# Patient Record
Sex: Female | Born: 1980 | Race: White | Hispanic: Yes | Marital: Single | Smoking: Never smoker
Health system: Southern US, Community
[De-identification: ages and names within clinical notes are randomized; demographics above are authoritative.]

## PROBLEM LIST (undated history)

## (undated) DIAGNOSIS — I10 Essential (primary) hypertension: Secondary | ICD-10-CM

## (undated) HISTORY — PX: NO PAST SURGERIES: SHX2092

---

## 2005-08-11 ENCOUNTER — Ambulatory Visit: Payer: Self-pay | Admitting: Family Medicine

## 2005-08-12 ENCOUNTER — Ambulatory Visit: Payer: Self-pay | Admitting: Family Medicine

## 2005-08-19 ENCOUNTER — Ambulatory Visit: Payer: Self-pay | Admitting: Sports Medicine

## 2005-08-22 ENCOUNTER — Ambulatory Visit (HOSPITAL_COMMUNITY): Admission: RE | Admit: 2005-08-22 | Discharge: 2005-08-22 | Payer: Self-pay | Admitting: Family Medicine

## 2005-08-27 ENCOUNTER — Ambulatory Visit: Payer: Self-pay | Admitting: Family Medicine

## 2005-08-30 ENCOUNTER — Ambulatory Visit: Payer: Self-pay | Admitting: Family Medicine

## 2005-09-10 ENCOUNTER — Ambulatory Visit: Payer: Self-pay | Admitting: Family Medicine

## 2005-09-18 ENCOUNTER — Ambulatory Visit (HOSPITAL_COMMUNITY): Admission: RE | Admit: 2005-09-18 | Discharge: 2005-09-18 | Payer: Self-pay | Admitting: Family Medicine

## 2005-09-24 ENCOUNTER — Ambulatory Visit: Payer: Self-pay | Admitting: Family Medicine

## 2005-10-01 ENCOUNTER — Ambulatory Visit: Payer: Self-pay | Admitting: Family Medicine

## 2005-10-08 ENCOUNTER — Ambulatory Visit (HOSPITAL_COMMUNITY): Admission: RE | Admit: 2005-10-08 | Discharge: 2005-10-08 | Payer: Self-pay | Admitting: *Deleted

## 2005-10-09 ENCOUNTER — Ambulatory Visit: Payer: Self-pay | Admitting: Family Medicine

## 2005-10-21 ENCOUNTER — Ambulatory Visit: Payer: Self-pay | Admitting: Sports Medicine

## 2005-11-04 ENCOUNTER — Ambulatory Visit: Payer: Self-pay | Admitting: *Deleted

## 2005-11-04 ENCOUNTER — Inpatient Hospital Stay (HOSPITAL_COMMUNITY): Admission: AD | Admit: 2005-11-04 | Discharge: 2005-11-06 | Payer: Self-pay | Admitting: *Deleted

## 2005-11-04 ENCOUNTER — Ambulatory Visit: Payer: Self-pay | Admitting: Family Medicine

## 2005-12-04 ENCOUNTER — Encounter (INDEPENDENT_AMBULATORY_CARE_PROVIDER_SITE_OTHER): Payer: Self-pay | Admitting: *Deleted

## 2005-12-04 LAB — CONVERTED CEMR LAB

## 2005-12-20 ENCOUNTER — Ambulatory Visit: Payer: Self-pay | Admitting: Family Medicine

## 2006-02-04 ENCOUNTER — Ambulatory Visit: Payer: Self-pay | Admitting: Family Medicine

## 2006-03-06 ENCOUNTER — Ambulatory Visit: Payer: Self-pay | Admitting: Family Medicine

## 2006-05-05 ENCOUNTER — Ambulatory Visit: Payer: Self-pay | Admitting: Family Medicine

## 2006-05-22 IMAGING — US US OB FOLLOW-UP
1 series · 13 of 23 positions shown · non-contrast
Comparison: none

CLINICAL DATA: Size less than dates.

[Series 1: us ob follow-up · 0.33mm/px · 13 of 23 slices shown]
[im 1/23]
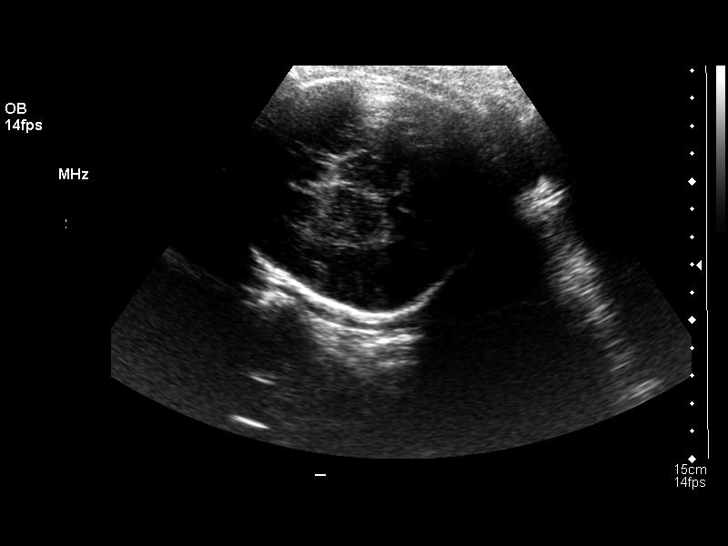
[im 3/23]
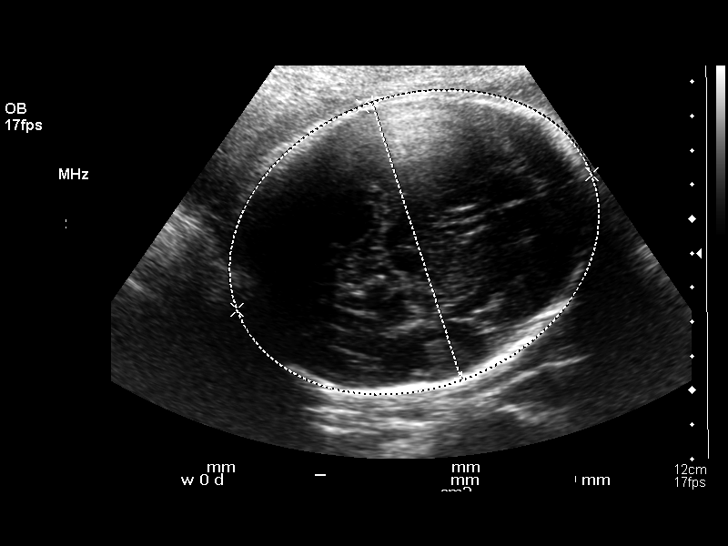
[im 5/23]
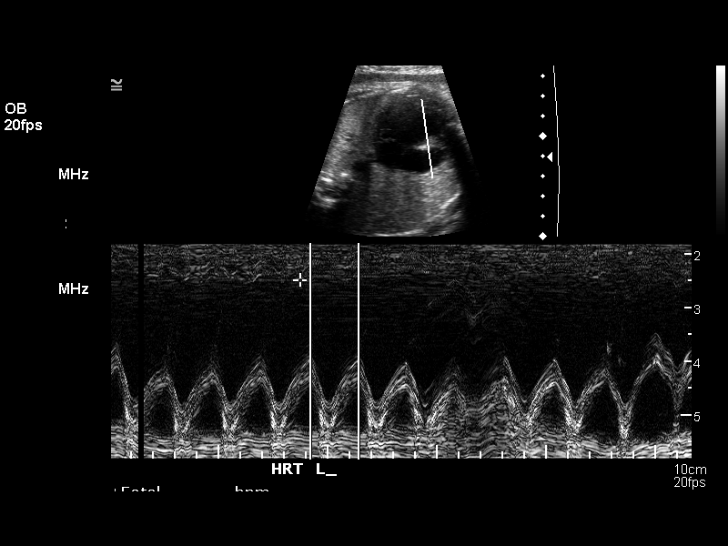
[im 7/23]
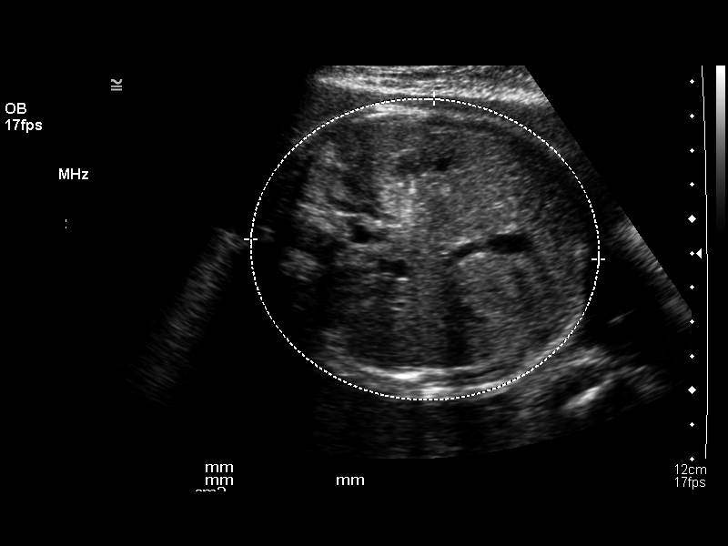
[im 8/23]
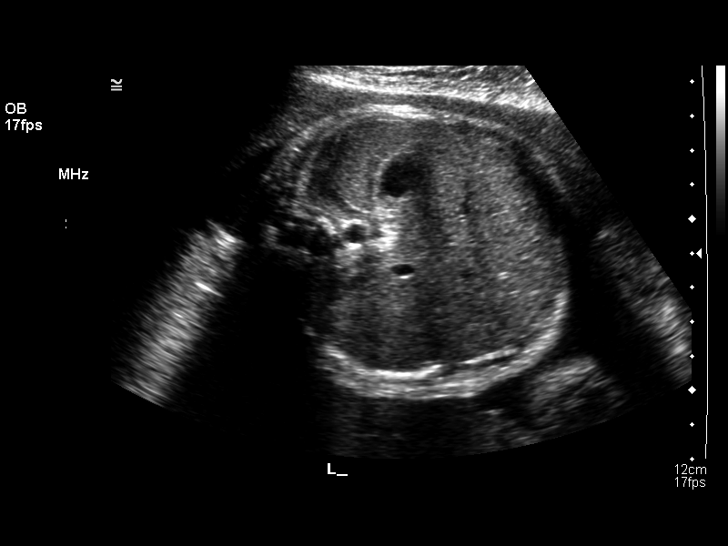
[im 10/23]
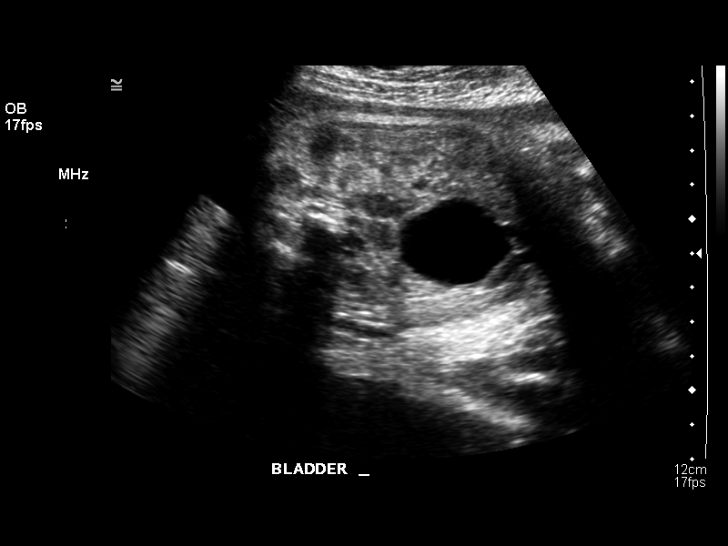
[im 12/23]
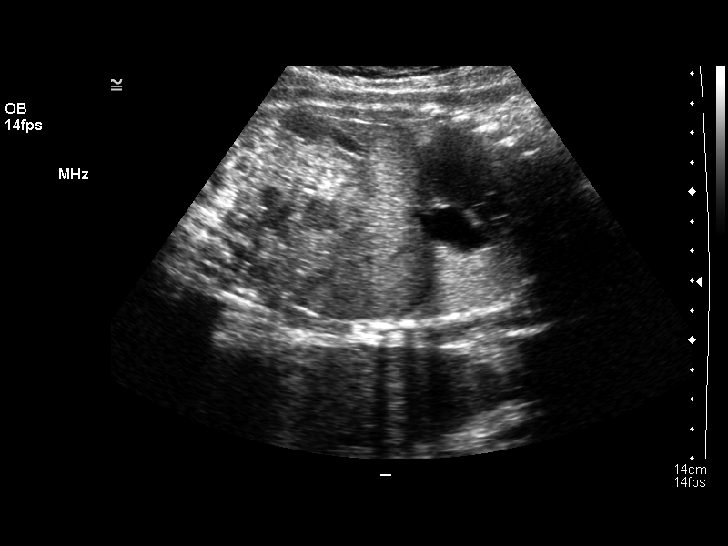
[im 14/23]
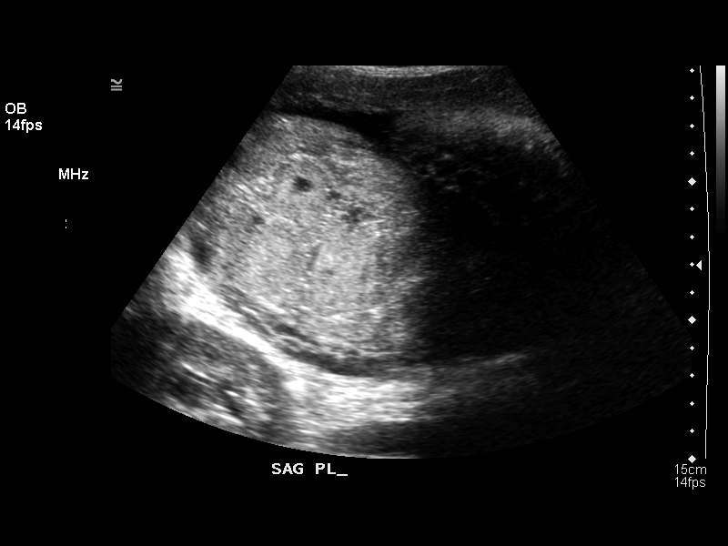
[im 16/23]
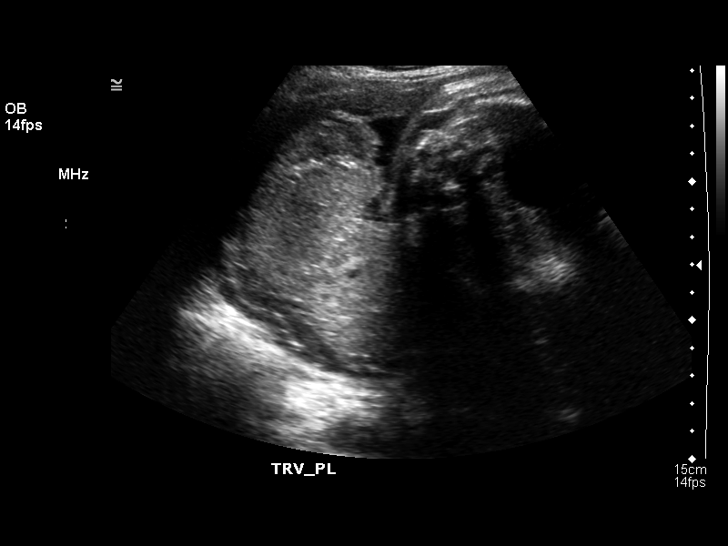
[im 17/23]
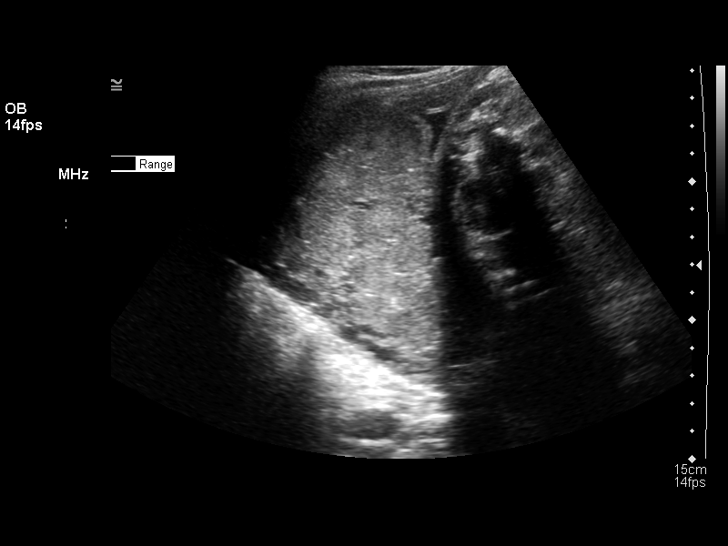
[im 19/23]
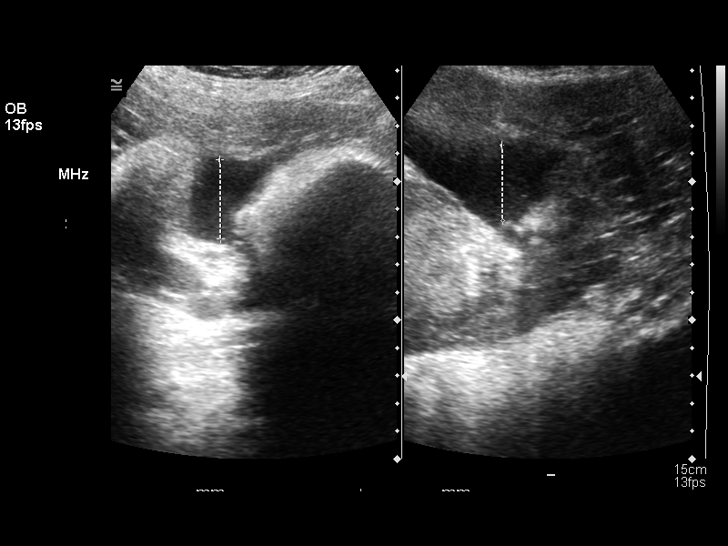
[im 21/23]
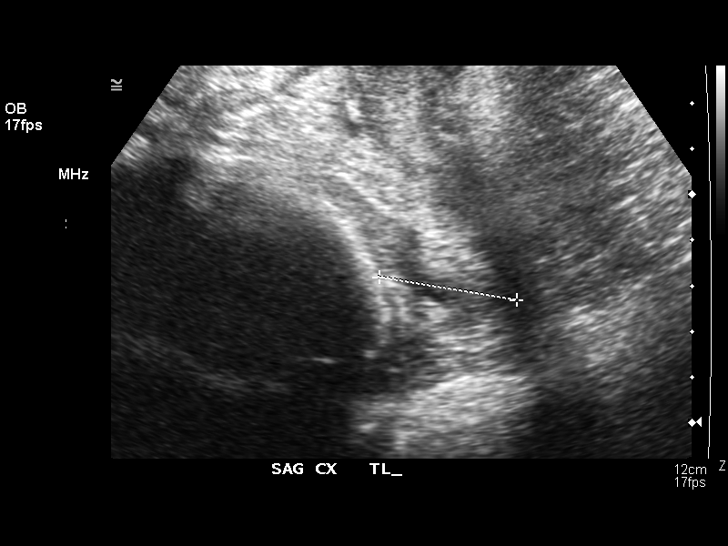
[im 23/23]
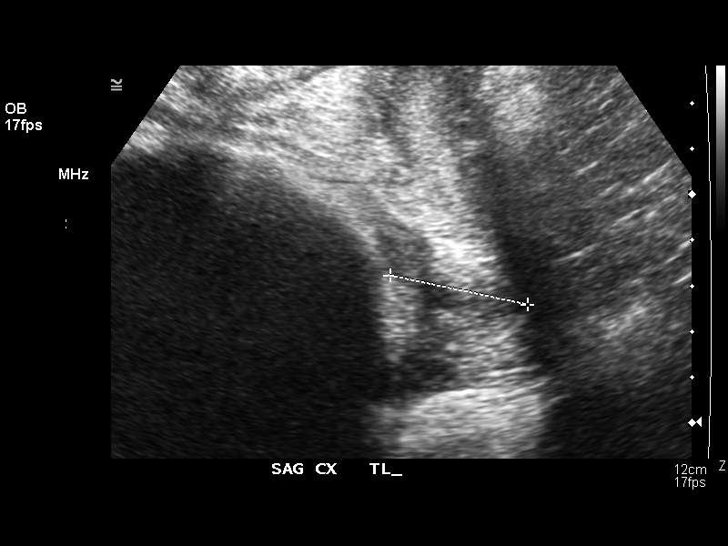

[13 of 23 positions shown; findings below may reference images not displayed]

OBSTETRICAL ULTRASOUND RE-EVALUATION:
 Number of Fetuses:  1
 Heart Rate:  136 bpm
 Movement:  yes
 Breathing:  yes
 Presentation:  cephalic
 Placental Location:  posterior
 Grade:  3
 Previa:  no
 Amniotic Fluid (subjective):    normal     Amniotic Fluid (objective):      AFI  10.4 cm (5th-85th %ile =  7.9 to 24.9 cm for 35 weeks) 

 FETAL BIOMETRY
 BPD:  8.4 cm  33 w 5 d
 HC:  30.8 cm  34 w 2 d
 AC:  29.8 cm  33 w 5 d
 FL:  6.5 cm  33 w 2 d

 Mean GA:  33 w 5 d
 Assigned GA:  34 w 6 d

 EFW:    1174 grams (H) 58th-82th %ile (5859 to 7696 g) for 35 weeks 

 FETAL ANATOMY
 Lateral Ventricles:  previously seen 
 Thalami/CSP:  previously seen 
 Posterior Fossa:  previously seen 
 Nuchal Region:  N/A
 Spine:  previously seen 
 4 Chamber Heart on Left:  visualized 
 Stomach on Left:  visualized 
 3 Vessel Cord:  previously seen 
 Cord Insertion Site:  previously seen 
 Kidneys:  visualized 
 Bladder:  visualized 
 Extremities:  previously seen 

 MATERNAL UTERINE AND ADNEXAL FINDINGS
 Cervix:  3.1 cm translabially
IMPRESSION: Single living intrauterine fetus in cephalic presentation with subjectively and quantitatively normal amniotic fluid volume.  Estimated mean gestational age by ultrasound today is 33 weeks 5 days which is approximately 1 week behind the reported assigned gestational age.  Best estimated fetal weight using the Felner score has fallen from the 50 to 75th percentile just to below the 50th percentile.  Conservatively, the follow-up ultrasound in 3 to 4 weeks could be used to ensure continued appropriate growth.

## 2006-06-11 IMAGING — US US OB FOLLOW-UP
1 series · 13 of 28 positions shown · non-contrast
Comparison: none

CLINICAL DATA: 37 week 5 day assigned gestational age by prior ultrasound.  Measuring small for dates.  Evaluate fetal growth and amniotic fluid volume.

[Series 1: us ob follow-up · 13 of 29 slices shown]
[im 2/29]
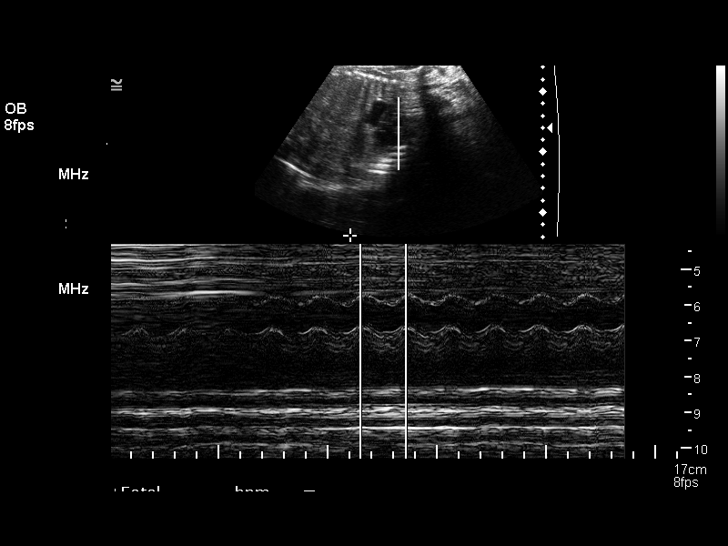
[im 4/29]
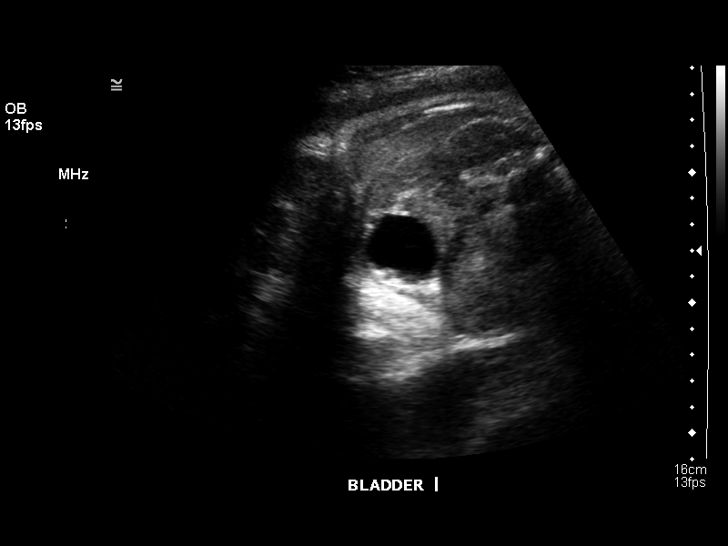
[im 6/29]
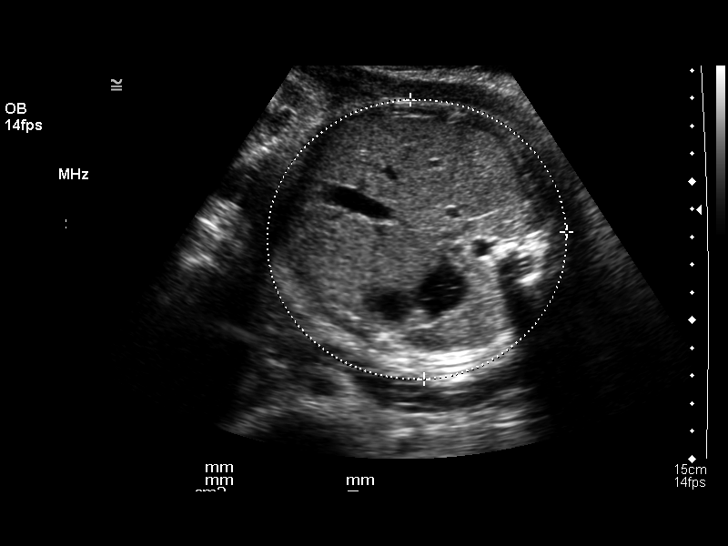
[im 8/29]
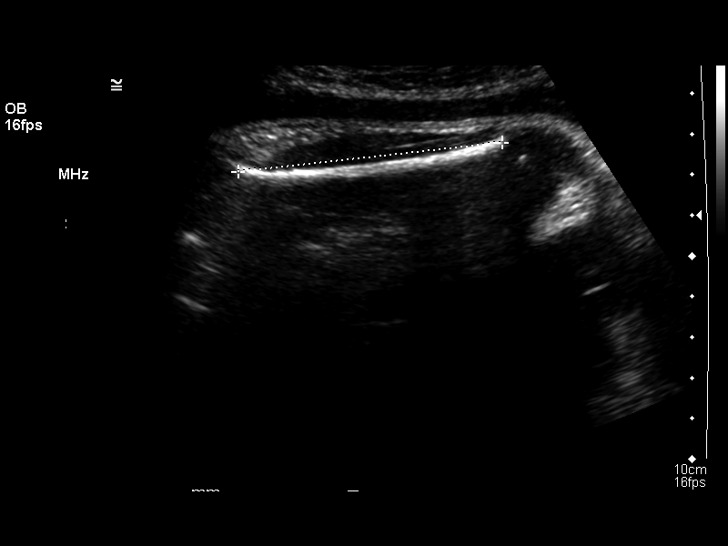
[im 10/29]
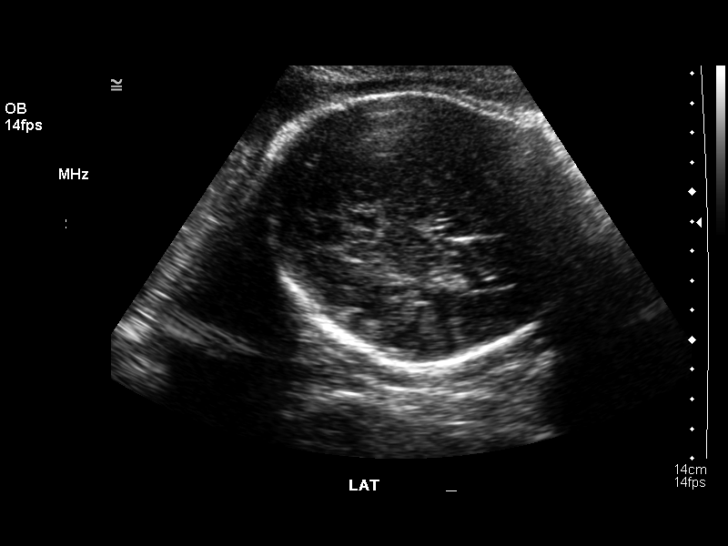
[im 12/29]
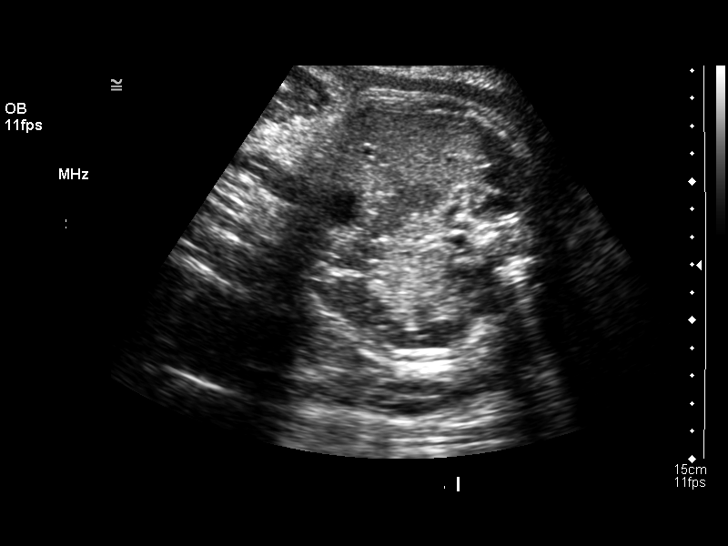
[im 15/29]
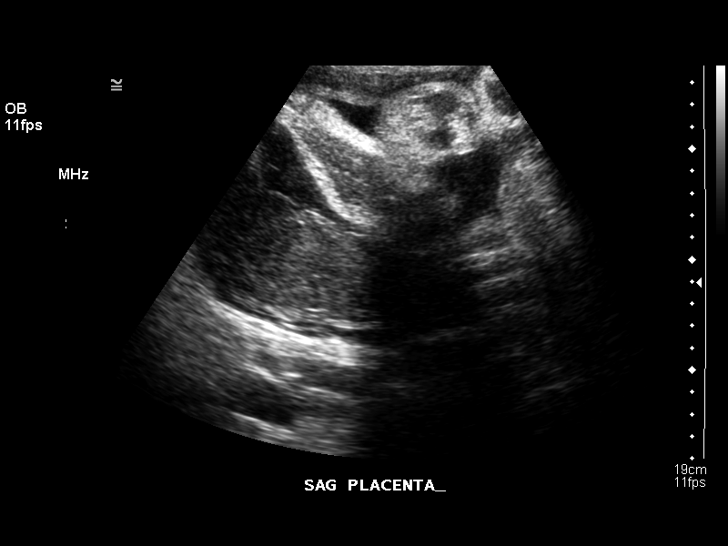
[im 17/29]
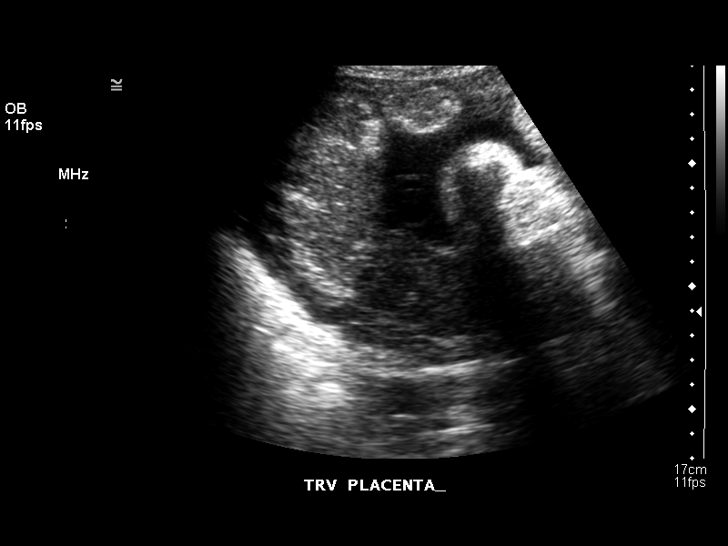
[im 19/29]
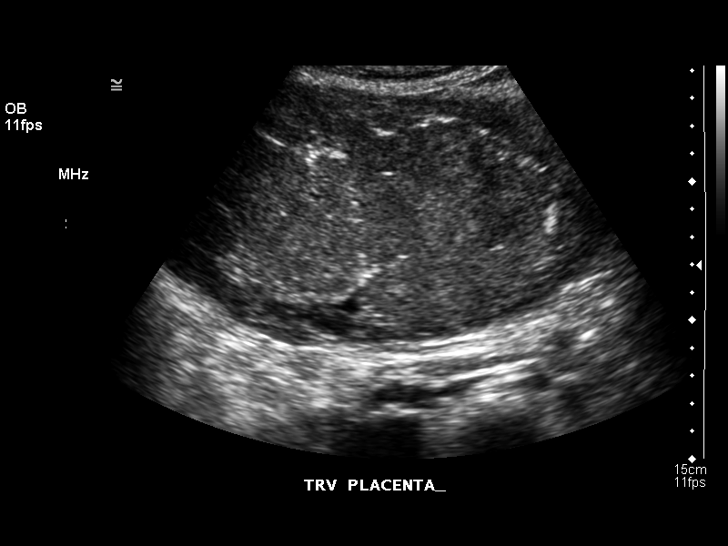
[im 21/29]
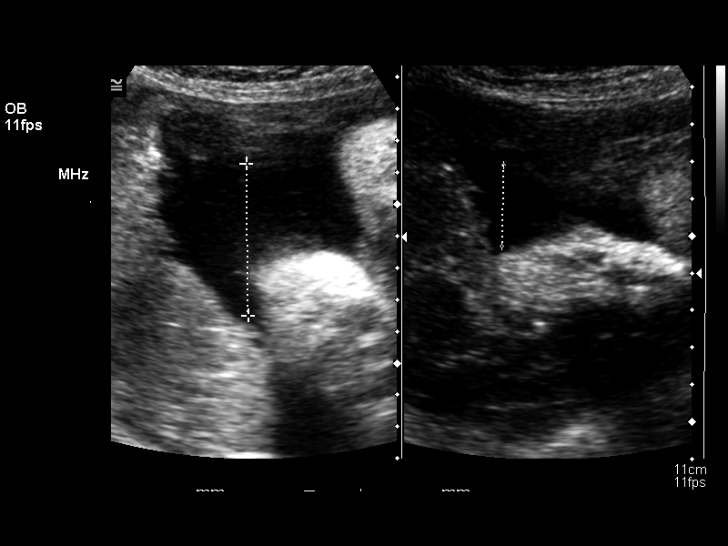
[im 23/29]
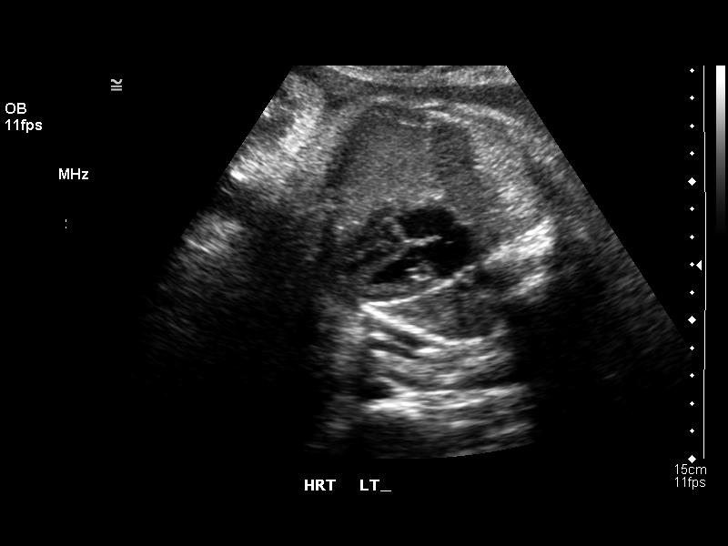
[im 25/29]
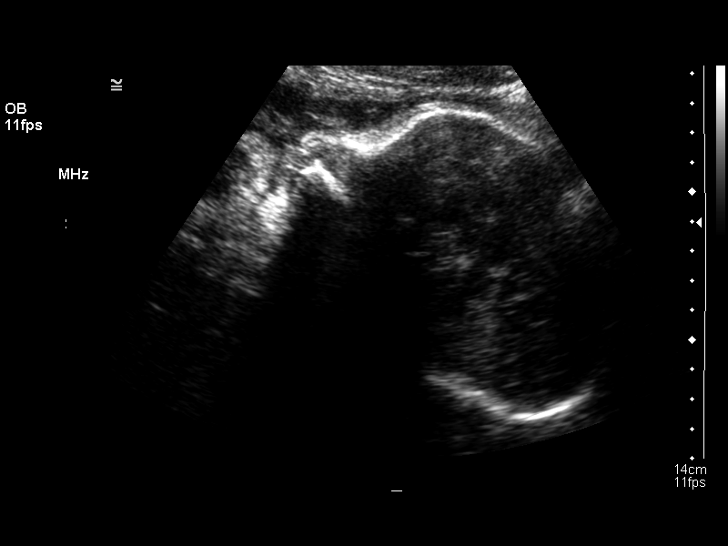
[im 27/29]
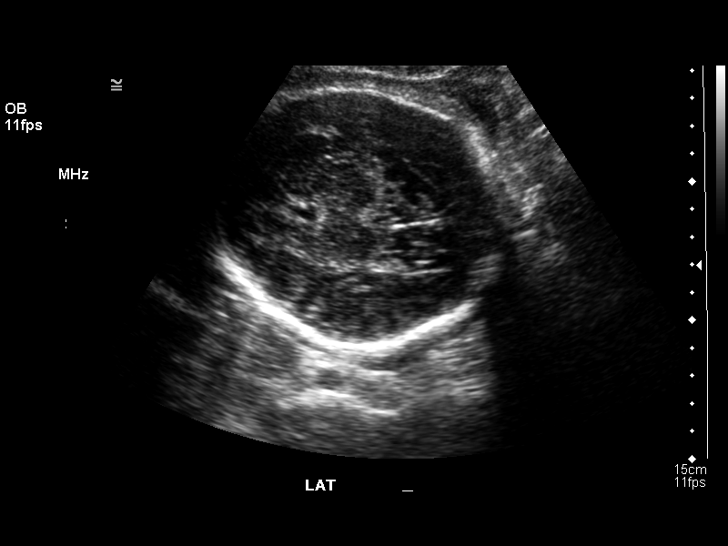

[13 of 28 positions shown; findings below may reference images not displayed]

OBSTETRICAL ULTRASOUND RE-EVALUATION:
Number of Fetuses: 1
Heart Rate:  144
Movement:  Yes
Breathing:  Yes
Presentation:  Cephalic
Placental Location:  Posterior
Grade:  II
Previa:  No
Amniotic Fluid (subjective):  Normal
Amniotic Fluid (objective):  11.4 cm AFI (5th -95th%ile = 7.3 ? 23.9 cm for 38 wks)

FETAL BIOMETRY
BPD:  8.7 cm   35 w 1 d
HC:  33.0 cm   37 w 2 d
AC:  33.0 cm   36 w 6 d
FL:  6.6 cm  33 w 6 d

Mean GA:  35 w 6 d
Assigned GA:  37 w 5 d

EFW:  7537 g (S) 25th ? 50th%ile (5611 ? 0400 g) For 38 wks

FETAL ANATOMY
Lateral Ventricles:  Visualized 
Thalami/CSP:  Previously seen 
Posterior Fossa:  Previously seen 
Nuchal Region:  N/A
Spine:  Previously seen 
4 Chamber Heart on Left:  Previously seen 
Stomach on Left:  Visualized 
3 Vessel Cord:  Previously seen 
Cord Insertion Site:  Not visualized 
Kidneys:  Visualized 
Bladder:  Visualized 
Extremities:  Not visualized 
Evaluation limited by:  Advanced gestational age.

MATERNAL UTERINE AND ADNEXAL FINDINGS
Cervix:  Not evaluated
IMPRESSION: 1.  Assigned gestational age by ultrasound is currently 37 weeks 5 days.  Appropriate interval fetal growth, with AC within one week and EFW remaining between 25th ? 50th percentile.
2.  Normal amniotic fluid volume, with AFI 11.4 cm.

## 2006-07-22 ENCOUNTER — Ambulatory Visit: Payer: Self-pay | Admitting: Family Medicine

## 2006-10-13 ENCOUNTER — Ambulatory Visit: Payer: Self-pay | Admitting: Sports Medicine

## 2007-02-27 ENCOUNTER — Encounter (INDEPENDENT_AMBULATORY_CARE_PROVIDER_SITE_OTHER): Payer: Self-pay | Admitting: *Deleted

## 2007-09-07 ENCOUNTER — Ambulatory Visit: Payer: Self-pay | Admitting: Family Medicine

## 2007-09-07 ENCOUNTER — Encounter (INDEPENDENT_AMBULATORY_CARE_PROVIDER_SITE_OTHER): Payer: Self-pay | Admitting: Family Medicine

## 2007-09-07 LAB — CONVERTED CEMR LAB
Antibody Screen: NEGATIVE
Basophils Absolute: 0.1 10*3/uL (ref 0.0–0.1)
Basophils Relative: 1 % (ref 0–1)
Eosinophils Absolute: 1.8 10*3/uL — ABNORMAL HIGH (ref 0.0–0.7)
Eosinophils Relative: 13 % — ABNORMAL HIGH (ref 0–5)
HCT: 38.9 % (ref 36.0–46.0)
Hemoglobin: 13.2 g/dL (ref 12.0–15.0)
Hepatitis B Surface Ag: NEGATIVE
Lymphocytes Relative: 23 % (ref 12–46)
Lymphs Abs: 3 10*3/uL (ref 0.7–3.3)
MCHC: 33.9 g/dL (ref 30.0–36.0)
MCV: 88.8 fL (ref 78.0–100.0)
Monocytes Absolute: 0.7 10*3/uL (ref 0.2–0.7)
Monocytes Relative: 6 % (ref 3–11)
Neutro Abs: 7.7 10*3/uL (ref 1.7–7.7)
Neutrophils Relative %: 58 % (ref 43–77)
Platelets: 297 10*3/uL (ref 150–400)
RBC: 4.38 M/uL (ref 3.87–5.11)
RDW: 13.7 % (ref 11.5–14.0)
Rh Type: POSITIVE
Rubella antibody, serum, titer: IMMUNE
Rubella: 97.3 intl units/mL — ABNORMAL HIGH
Urine culture (CF units/mL): NEGATIVE CFunits/mL
WBC: 13.2 10*3/uL — ABNORMAL HIGH (ref 4.0–10.5)

## 2007-09-21 ENCOUNTER — Encounter (INDEPENDENT_AMBULATORY_CARE_PROVIDER_SITE_OTHER): Payer: Self-pay | Admitting: Family Medicine

## 2007-09-21 ENCOUNTER — Ambulatory Visit: Payer: Self-pay | Admitting: Sports Medicine

## 2007-09-21 ENCOUNTER — Encounter: Payer: Self-pay | Admitting: Family Medicine

## 2007-09-21 LAB — CONVERTED CEMR LAB
Chlamydia, DNA Probe: NEGATIVE
GC Culture Only: NEGATIVE
GC Probe Amp, Genital: NEGATIVE
GTT, 1 hr: 156 mg/dL
Glucose, Urine, Semiquant: NEGATIVE
Pap Smear: NORMAL
Protein, U semiquant: NEGATIVE

## 2007-09-23 ENCOUNTER — Ambulatory Visit (HOSPITAL_COMMUNITY): Admission: RE | Admit: 2007-09-23 | Discharge: 2007-09-23 | Payer: Self-pay | Admitting: Family Medicine

## 2007-09-24 ENCOUNTER — Encounter (INDEPENDENT_AMBULATORY_CARE_PROVIDER_SITE_OTHER): Payer: Self-pay | Admitting: Family Medicine

## 2007-09-24 ENCOUNTER — Ambulatory Visit: Payer: Self-pay | Admitting: Family Medicine

## 2007-10-06 ENCOUNTER — Telehealth (INDEPENDENT_AMBULATORY_CARE_PROVIDER_SITE_OTHER): Payer: Self-pay | Admitting: *Deleted

## 2007-10-19 ENCOUNTER — Ambulatory Visit: Payer: Self-pay | Admitting: Sports Medicine

## 2007-10-19 LAB — CONVERTED CEMR LAB
Glucose, Urine, Semiquant: NEGATIVE
Protein, U semiquant: NEGATIVE

## 2007-10-23 LAB — CONVERTED CEMR LAB: GTT, 1 hr: 3 mg/dL

## 2007-10-29 ENCOUNTER — Encounter (INDEPENDENT_AMBULATORY_CARE_PROVIDER_SITE_OTHER): Payer: Self-pay | Admitting: Family Medicine

## 2007-11-10 ENCOUNTER — Encounter (INDEPENDENT_AMBULATORY_CARE_PROVIDER_SITE_OTHER): Payer: Self-pay | Admitting: Family Medicine

## 2007-11-23 ENCOUNTER — Ambulatory Visit: Payer: Self-pay | Admitting: Family Medicine

## 2007-11-23 LAB — CONVERTED CEMR LAB
Glucose, Urine, Semiquant: NEGATIVE
Protein, U semiquant: NEGATIVE

## 2007-11-24 ENCOUNTER — Encounter (INDEPENDENT_AMBULATORY_CARE_PROVIDER_SITE_OTHER): Payer: Self-pay | Admitting: Family Medicine

## 2007-11-24 ENCOUNTER — Ambulatory Visit (HOSPITAL_COMMUNITY): Admission: RE | Admit: 2007-11-24 | Discharge: 2007-11-24 | Payer: Self-pay | Admitting: Family Medicine

## 2007-12-28 ENCOUNTER — Ambulatory Visit: Payer: Self-pay | Admitting: Sports Medicine

## 2007-12-28 LAB — CONVERTED CEMR LAB: Glucose, Urine, Semiquant: NEGATIVE

## 2008-01-11 ENCOUNTER — Ambulatory Visit: Payer: Self-pay | Admitting: Family Medicine

## 2008-01-11 LAB — CONVERTED CEMR LAB: GTT, 1 hr: 140 mg/dL

## 2008-01-27 ENCOUNTER — Ambulatory Visit: Payer: Self-pay | Admitting: Family Medicine

## 2008-01-27 ENCOUNTER — Encounter (INDEPENDENT_AMBULATORY_CARE_PROVIDER_SITE_OTHER): Payer: Self-pay | Admitting: Family Medicine

## 2008-01-27 LAB — CONVERTED CEMR LAB
Glucose, Urine, Semiquant: NEGATIVE
Protein, U semiquant: NEGATIVE
Whiff Test: NEGATIVE

## 2008-01-28 LAB — CONVERTED CEMR LAB
Chlamydia, DNA Probe: NEGATIVE
GC Probe Amp, Genital: NEGATIVE

## 2008-02-09 ENCOUNTER — Encounter: Payer: Self-pay | Admitting: Family Medicine

## 2008-02-09 ENCOUNTER — Ambulatory Visit: Payer: Self-pay | Admitting: Family Medicine

## 2008-02-09 LAB — CONVERTED CEMR LAB
Glucose, Urine, Semiquant: NEGATIVE
Protein, U semiquant: NEGATIVE

## 2008-02-23 ENCOUNTER — Ambulatory Visit: Payer: Self-pay | Admitting: Family Medicine

## 2008-02-23 LAB — CONVERTED CEMR LAB
Glucose, Urine, Semiquant: NEGATIVE
Protein, U semiquant: NEGATIVE

## 2008-03-09 ENCOUNTER — Encounter (INDEPENDENT_AMBULATORY_CARE_PROVIDER_SITE_OTHER): Payer: Self-pay | Admitting: Family Medicine

## 2008-03-09 ENCOUNTER — Ambulatory Visit: Payer: Self-pay | Admitting: Family Medicine

## 2008-03-09 LAB — CONVERTED CEMR LAB: Glucose, Urine, Semiquant: NEGATIVE

## 2008-03-16 ENCOUNTER — Encounter: Payer: Self-pay | Admitting: Family Medicine

## 2008-03-16 ENCOUNTER — Ambulatory Visit: Payer: Self-pay | Admitting: Family Medicine

## 2008-03-16 LAB — CONVERTED CEMR LAB
Glucose, Urine, Semiquant: NEGATIVE
Hemoglobin: 11.4 g/dL

## 2008-03-23 ENCOUNTER — Ambulatory Visit: Payer: Self-pay | Admitting: Family Medicine

## 2008-03-23 LAB — CONVERTED CEMR LAB
Glucose, Urine, Semiquant: NEGATIVE
Protein, U semiquant: NEGATIVE

## 2008-04-01 ENCOUNTER — Ambulatory Visit: Payer: Self-pay | Admitting: Family Medicine

## 2008-04-01 LAB — CONVERTED CEMR LAB
Glucose, Urine, Semiquant: NEGATIVE
Protein, U semiquant: NEGATIVE

## 2008-04-04 ENCOUNTER — Encounter (INDEPENDENT_AMBULATORY_CARE_PROVIDER_SITE_OTHER): Payer: Self-pay | Admitting: Family Medicine

## 2008-04-04 ENCOUNTER — Inpatient Hospital Stay (HOSPITAL_COMMUNITY): Admission: AD | Admit: 2008-04-04 | Discharge: 2008-04-07 | Payer: Self-pay | Admitting: Obstetrics & Gynecology

## 2008-04-04 ENCOUNTER — Ambulatory Visit: Payer: Self-pay | Admitting: Family

## 2008-04-05 ENCOUNTER — Ambulatory Visit: Payer: Self-pay | Admitting: Family Medicine

## 2008-04-07 ENCOUNTER — Encounter (INDEPENDENT_AMBULATORY_CARE_PROVIDER_SITE_OTHER): Payer: Self-pay | Admitting: Family Medicine

## 2008-05-18 ENCOUNTER — Ambulatory Visit: Payer: Self-pay | Admitting: Family Medicine

## 2008-06-23 ENCOUNTER — Ambulatory Visit: Payer: Self-pay | Admitting: Family Medicine

## 2008-09-13 ENCOUNTER — Ambulatory Visit: Payer: Self-pay | Admitting: Family Medicine

## 2008-10-19 ENCOUNTER — Ambulatory Visit: Payer: Self-pay | Admitting: Family Medicine

## 2008-12-12 ENCOUNTER — Ambulatory Visit: Payer: Self-pay | Admitting: Family Medicine

## 2009-03-02 ENCOUNTER — Ambulatory Visit: Payer: Self-pay | Admitting: Family Medicine

## 2010-12-13 ENCOUNTER — Encounter: Payer: Self-pay | Admitting: Family Medicine

## 2011-01-30 ENCOUNTER — Encounter: Payer: Self-pay | Admitting: *Deleted

## 2011-01-31 NOTE — Miscellaneous (Signed)
Summary: QI project   Clinical Lists Changes  Problems: Removed problem of NEED PROPHYLACTIC VACCINATION&INOCULATION FLU (ICD-V04.81) Removed problem of CANDIDIASIS, VAGINAL (ICD-112.1) Removed problem of VAGINAL DISCHARGE (ICD-623.5) Removed problem of History of  PRE-ECLAMPSIA (ICD-642.40) Removed problem of PREGNANCY, NORMAL, MULTIGRAVIDA (ICD-V22.1) Removed problem of HX OF GESTATIONAL DIABETES (ICD-648.80) Removed problem of PREGNANCY, NORMAL (ICD-V22.2) Observations: Added new observation of PAST MED HX: G1P1 NSVD with Pre-eclampsia at delivery (PP Mag) h/o Chlamydia during first pregnancy treated.  h/o pre-eclampsia h/o gestational diabetes  (12/13/2010 9:34)       Past History:  Past Medical History: G1P1 NSVD with Pre-eclampsia at delivery (PP Mag) h/o Chlamydia during first pregnancy treated.  h/o pre-eclampsia h/o gestational diabetes

## 2011-05-17 NOTE — Discharge Summary (Signed)
[name], [name]     ACCOUNT NO.:  [id number]   MEDICAL RECORD NO.:  [id number]          PATIENT TYPE:  INP   LOCATION:  9144                          FACILITY:  [facility]   PHYSICIAN:  Conni Elliot, M.D.DATE OF BIRTH:  08/09/1981   DATE OF ADMISSION:  11/04/2005  DATE OF DISCHARGE:  11/06/2005                                 DISCHARGE SUMMARY   DISCHARGE DIAGNOSES:  1.  Full-term delivery of viable female.  2.  Preeclampsia.  3.  Meconium.  4.  Rubella nonimmune.   DISCHARGE MEDICATIONS:  Prenatal vitamins.   HOSPITAL COURSE:  [name] is a 30 year old now G1 P1 who presented to the  MAU with contractions in early labor. In transit to labor and delivery suite  she had spontaneous rupture of membranes with meconium-stained fluid. She  was also noted to have elevated blood pressure on admission. She has had  proteinuria on occasion through her prenatal course. She was also found to  have elevated uric acid on a preeclamptic panel on admission. Thus, the  patient was diagnosed as being preeclamptic.   For the meconium-stained fluid, amnioinfusion was initiated. Following  spontaneous rupture of membranes the patient was not having very strong  contractions and the patient was then started on Pitocin for labor  augmentation. Magnesium levels were followed and were adjusted accordingly  to maintain therapeutic doses. On the evening of admission the patient  delivered a viable female infant. NICU team was present. The patient did  have some moderate blood loss after placenta delivery that corrected with  Pitocin. She had a minor first-degree laceration that was controlled with  pressure. The patient continued to have bleeding that same night overnight.  A DIC panel was drawn which was negative. The patient also had a temperature  of 101.5 and was given Tylenol. However, her temperature responded well and  she never had another fever. The patient was given Cytotec 800 mg  per rectum  which then controlled the bleeding and the patient then had no further  problems with bleeding.   The patient was maintained on magnesium. In the postnatal course,  interestingly, she did have a low protein and albumin of 4.3 and 1.8 which  would be consistent with significant proteinuria during her pregnancy.  However, following delivery and with continuation of the magnesium the  patient's blood pressures were well controlled. She had adequate urinary  output and the magnesium was stopped approximately 24 hours after delivery.   On the following day the patient continued to do well with normal blood  pressures and overall feeling well, and was ready to go home. She was  started on iron for some mild anemia.   The patient was given rubella vaccination prior to discharge. She was also  given Depo-Provera 150 mg IM x1 prior to discharge.   DISCHARGE LABORATORY DATA:  Hemoglobin 9.5, hematocrit 28.7, platelets 229,  white blood cell count 16.2. Sodium 139, potassium 4.1, chloride 104, bicarb  30, BUN 9, creatinine 0.7, glucose 90, total protein 5.2, albumin 2.1, AST  29, ALT 18, alk phos 127, bilirubin 0.1, LDH 174, and uric acid 6.4.   DISCHARGE INSTRUCTIONS:  The patient was instructed to use ibuprofen as  needed for pain. She was instructed to take her prenatal vitamins daily  while she is breastfeeding. She was instructed to come back to [facility] in 6 weeks for her postpartum visit.      Franchot Mimes, MD    ______________________________  Conni Elliot, M.D.    TV/MEDQ  D:  01/02/2006  T:  01/02/2006  Job:  [id number]

## 2011-09-24 LAB — CBC
HCT: 34.9 — ABNORMAL LOW
Hemoglobin: 11.9 — ABNORMAL LOW
MCHC: 34.2
MCV: 82.3
Platelets: 291
RBC: 4.24
RDW: 21.7 — ABNORMAL HIGH
WBC: 14.2 — ABNORMAL HIGH

## 2011-09-24 LAB — RPR: RPR Ser Ql: NONREACTIVE

## 2013-09-01 ENCOUNTER — Ambulatory Visit: Payer: Self-pay | Admitting: Emergency Medicine

## 2013-09-01 VITALS — BP 100/60 | HR 52 | Temp 98.8°F | Resp 16 | Ht 61.0 in | Wt 122.0 lb

## 2013-09-01 DIAGNOSIS — B351 Tinea unguium: Secondary | ICD-10-CM

## 2013-09-01 MED ORDER — TERBINAFINE HCL 250 MG PO TABS: 250.0000 mg | ORAL_TABLET | Freq: Every day | ORAL | Status: DC

## 2013-09-01 NOTE — Patient Instructions (Addendum)
Tia de las uas (Ringworm, Nail) Usted presenta una infeccin por hongos en las uas de los pies. La parte visible de las uas est formada por clulas muertas que no tienen suministro sanguneo que intervenga en la prevencin de las infecciones. La infeccin se produce debido a que los hongos estn en todas partes. Aprovecharn cualquier oportunidad para crecer sobre material inerte. Esto incluye los tejidos de su cuerpo formados por clulas muertas.  Como las uas tienen un crecimiento muy lento, requieren hasta 2 aos de tratamiento con medicamentos antimicticos. La infeccin involucra a toda la ua, hasta la base. Incluye aproximadamente 1/3 de la ua que no puede verse. Si el profesional le ha prescrito un medicamento por boca, tmelo todos los das. No podr ver ningn progreso hasta que hayan transcurrido entre 6 y 9 meses. No debe preocuparse. La curacin es lenta. Se debe a que el medicamento llega hasta la infeccin de manera muy lenta. Los hongos pueden vivir sobre las clulas muertas con poca o casi ninguna exposicin al suministro de sangre. Esta tambin es la razn por la cual no se observa mejora en los primeros 6 meses. La ua comienza la curacin en la base, donde hay suministro de sangre. La medicacin tpica, como las cremas y los ungentos generalmente no son eficaces. Usted junto al profesional podrn elegir acelerar el proceso de curacin con la extraccin quirrgica de todas las uas. An as, puede necesitar entre 6 y 9 meses de medicamentos por va oral adicionales. Concurra a la consulta con el profesional que lo asiste de acuerdo a lo que le haya indicado. Recuerde que no observar mejora durante al menos 6 meses. Consulte antes con el profesional si aparecen otros signos de infeccin (p. ej. enrojecimiento e hinchazn). Document Released: 09/25/2005 Document Revised: 03/09/2012 [employer] Patient Information 2014 [employer].  

## 2013-09-01 NOTE — Progress Notes (Signed)
[facility] [address], [place] [address] [phone]  Date:  09/01/2013   Name:  [name]   DOB:  [birth year]   MRN:  [id number]  PCP:  No PCP Per Patient    Chief Complaint: Nail Problem   History of Present Illness:  [name] is a 32 y.o. very pleasant female patient who presents with the following:  Thick great toenail.  History of ingrowing and treated surgically. Not painful. No history of liver disease.  No improvement with over the counter medications or other home remedies. Denies other complaint or health concern today.   There are no active problems to display for this patient.   History reviewed. No pertinent past medical history.  History reviewed. No pertinent past surgical history.  History  Substance Use Topics  . Smoking status: Never Smoker   . Smokeless tobacco: Not on file  . Alcohol Use: Not on file    History reviewed. No pertinent family history.  No Known Allergies  Medication list has been reviewed and updated.  Current Outpatient Prescriptions on File Prior to Visit  Medication Sig Dispense Refill  . fluconazole (DIFLUCAN) 150 MG tablet Take 150 mg by mouth once. Una tableta una ves para su infeccion de ungo.       . Prenatal Multivit-Min-Fe-FA (PRENATAL VITAMINS) 0.8 MG tablet Take 1 tablet by mouth daily. Una tableta diario.        No current facility-administered medications on file prior to visit.    Review of Systems:  As per HPI, otherwise negative.    Physical Examination: Filed Vitals:   09/01/13 1002  BP: 100/60  Pulse: 52  Temp: 98.8 F (37.1 C)  Resp: 16   Filed Vitals:   09/01/13 1002  Height: 5\' 1"  (1.549 m)  Weight: 122 lb (55.339 kg)   Body mass index is 23.06 kg/(m^2). Ideal Body Weight: Weight in (lb) to have BMI = 25: 132   GEN: WDWN, NAD, Non-toxic, Alert & Oriented x 3 HEENT: Atraumatic, Normocephalic.  Ears and Nose: No external  deformity. EXTR: No clubbing/cyanosis/edema NEURO: Normal gait.  PSYCH: Normally interactive. Conversant. Not depressed or anxious appearing.  Calm demeanor.  Right great toe nail:  onychomychosis  Assessment and Plan: Onychomycosis lamisil Monthly LFT's   Signed,  Phillips Odor, MD

## 2015-06-21 ENCOUNTER — Ambulatory Visit (INDEPENDENT_AMBULATORY_CARE_PROVIDER_SITE_OTHER): Payer: Self-pay | Admitting: Emergency Medicine

## 2015-06-21 VITALS — BP 98/68 | HR 75 | Temp 98.8°F | Resp 16 | Ht 61.0 in | Wt 118.8 lb

## 2015-06-21 DIAGNOSIS — N911 Secondary amenorrhea: Secondary | ICD-10-CM

## 2015-06-21 DIAGNOSIS — Z3201 Encounter for pregnancy test, result positive: Secondary | ICD-10-CM

## 2015-06-21 DIAGNOSIS — Z3490 Encounter for supervision of normal pregnancy, unspecified, unspecified trimester: Secondary | ICD-10-CM

## 2015-06-21 LAB — POCT URINE PREGNANCY: Preg Test, Ur: POSITIVE — AB

## 2015-06-21 MED ORDER — PRENATAL VITAMINS 28-0.8 MG PO TABS: 1.0000 | ORAL_TABLET | Freq: Every day | ORAL | Status: DC

## 2015-06-21 NOTE — Progress Notes (Addendum)
   Subjective:   This chart was scribed for Earl Lites, MD by Andrew Au, ED Scribe. This patient was seen in room 10 and the patient's care was started at 2:28 PM.   Patient ID: [name], female    DOB: 05/06/1981, 34 y.o.   MRN: [id number]  HPI Chief Complaint  Patient presents with  . pregnancy test    last period was in May   HPI Comments: [place] is a 34 y.o. female who presents to the [facility] for a pregnancy test. States her last menstrual period was May 10. She has two children a 55 y.o and a 33 y.o. She denies symptoms such as breast soreness and nausea. She denies being on birth control and taking daily medications.  History reviewed. No pertinent past medical history. History reviewed. No pertinent past surgical history. Prior to Admission medications   Medication Sig Start Date End Date Taking? Authorizing Provider  fluconazole (DIFLUCAN) 150 MG tablet Take 150 mg by mouth once. Una tableta una ves para su infeccion de ungo.     Historical Provider, MD  Prenatal Multivit-Min-Fe-FA (PRENATAL VITAMINS) 0.8 MG tablet Take 1 tablet by mouth daily. Una tableta diario.     Historical Provider, MD  terbinafine (LAMISIL) 250 MG tablet Take 1 tablet (250 mg total) by mouth daily. Patient not taking: Reported on 06/21/2015 09/01/13   Carmelina Dane, MD   Review of Systems  Gastrointestinal: Negative for nausea, vomiting and abdominal pain.    Objective:   Physical Exam CONSTITUTIONAL: Well developed/well nourished HEAD: Normocephalic/atraumatic EYES: EOMI/PERRL ENMT: Mucous membranes moist NECK: supple no meningeal signs SPINE/BACK:entire spine nontender CV: S1/S2 noted, no murmurs/rubs/gallops noted LUNGS: Lungs are clear to auscultation bilaterally, no apparent distress ABDOMEN: soft, nontender, no rebound or guarding, bowel sounds noted throughout abdomen GU:no cva tenderness NEURO: Pt is awake/alert/appropriate, moves  all extremitiesx4.  No facial droop.   EXTREMITIES: pulses normal/equal, full ROM SKIN: warm, color normal PSYCH: no abnormalities of mood noted, alert and oriented to situation  Filed Vitals:   06/21/15 1411  BP: 98/68  Pulse: 75  Temp: 98.8 F (37.1 C)  Resp: 16    Results for orders placed or performed in visit on 06/21/15  POCT urine pregnancy  Result Value Ref Range   Preg Test, Ur Positive (A) Negative    Assessment & Plan:  Referral made to [facility]. Will start prenatal vitamins 1 a day.I personally performed the services described in this documentation, which was scribed in my presence. The recorded information has been reviewed and is accurate.  Earl Lites, MD

## 2015-06-21 NOTE — Patient Instructions (Signed)
Primer trimestre de embarazo (First Trimester of Pregnancy) El primer trimestre de embarazo se extiende desde la semana1 hasta el final de la semana12 (mes1 al mes3). Una semana despus de que un espermatozoide fecunda un vulo, este se implantar en la pared uterina. Este embrin comenzar a desarrollarse hasta convertirse en un beb. Sus genes y los de su pareja forman el beb. Los genes del varn determinan si ser un nio o una nia. Entre la semana6 y la8, se forman los ojos y el rostro, y los latidos del corazn pueden verse en la ecografa. Al final de las 12semanas, todos los rganos del beb estn formados.  Ahora que est embarazada, querr hacer todo lo que est a su alcance para tener un beb sano. Dos de las cosas ms importantes son tener una buena atencin prenatal y seguir las indicaciones del mdico. La atencin prenatal incluye toda la asistencia mdica que usted recibe antes del nacimiento del beb. Esta ayudar a prevenir, detectar y tratar cualquier problema durante el embarazo y el parto. CAMBIOS EN EL ORGANISMO Su organismo atraviesa por muchos cambios durante el embarazo, y estos varan de una mujer a otra.   Al principio, puede aumentar o bajar algunos kilos.  Puede tener malestar estomacal (nuseas) y vomitar. Si no puede controlar los vmitos, llame al mdico.  Puede cansarse con facilidad.  Es posible que tenga dolores de cabeza que pueden aliviarse con los medicamentos que el mdico le permita tomar.  Puede orinar con mayor frecuencia. El dolor al orinar puede significar que usted tiene una infeccin de la vejiga.  Debido al embarazo, puede tener acidez estomacal.  Puede estar estreida, ya que ciertas hormonas enlentecen los movimientos de los msculos que empujan los desechos a travs de los intestinos.  Pueden aparecer hemorroides o abultarse e hincharse las venas (venas varicosas).  Las mamas pueden empezar a agrandarse y estar sensibles. Los pezones  pueden sobresalir ms, y el tejido que los rodea (areola) tornarse ms oscuro.  Las encas pueden sangrar y estar sensibles al cepillado y al hilo dental.  Pueden aparecer zonas oscuras o manchas (cloasma, mscara del embarazo) en el rostro que probablemente se atenuarn despus del nacimiento del beb.  Los perodos menstruales se interrumpirn.  Tal vez no tenga apetito.  Puede sentir un fuerte deseo de consumir ciertos alimentos.  Puede tener cambios a nivel emocional da a da, por ejemplo, por momentos puede estar emocionada por el embarazo y por otros preocuparse porque algo pueda salir mal con el embarazo o el beb.  Tendr sueos ms vvidos y extraos.  Tal vez haya cambios en el cabello que pueden incluir su engrosamiento, crecimiento rpido y cambios en la textura. A algunas mujeres tambin se les cae el cabello durante o despus del embarazo, o tienen el cabello seco o fino. Lo ms probable es que el cabello se le normalice despus del nacimiento del beb. QU DEBE ESPERAR EN LAS CONSULTAS PRENATALES Durante una visita prenatal de rutina:  La pesarn para asegurarse de que usted y el beb estn creciendo normalmente.  Le controlarn la presin arterial.  Le medirn el abdomen para controlar el desarrollo del beb.  Se escucharn los latidos cardacos a partir de la semana10 o la12 de embarazo, aproximadamente.  Se analizarn los resultados de los estudios solicitados en visitas anteriores. El mdico puede preguntarle:  Cmo se siente.  Si siente los movimientos del beb.  Si ha tenido sntomas anormales, como prdida de lquido, sangrado, dolores de cabeza intensos o   clicos abdominales.  Si tiene alguna pregunta. Otros estudios que pueden realizarse durante el primer trimestre incluyen lo siguiente:  Anlisis de sangre para determinar el tipo de sangre y detectar la presencia de infecciones previas. Adems, se los usar para controlar si los niveles de hierro  son bajos (anemia) y determinar los anticuerpos Rh. En una etapa ms avanzada del embarazo, se harn anlisis de sangre para saber si tiene diabetes, junto con otros estudios si surgen problemas.  Anlisis de orina para detectar infecciones, diabetes o protenas en la orina.  Una ecografa para confirmar que el beb crece y se desarrolla correctamente.  Una amniocentesis para diagnosticar posibles problemas genticos.  Estudios del feto para descartar espina bfida y sndrome de Down.  Es posible que necesite otras pruebas adicionales. INSTRUCCIONES PARA EL CUIDADO EN EL HOGAR  Medicamentos:  Siga las indicaciones del mdico en relacin con el uso de medicamentos. Durante el embarazo, hay medicamentos que pueden tomarse y otros que no.  Tome las vitaminas prenatales como se le indic.  Si est estreida, tome un laxante suave, si el mdico lo autoriza. Dieta  Consuma alimentos balanceados. Elija alimentos variados, como carne o protenas de origen vegetal, pescado, leche y productos lcteos descremados, verduras, frutas y panes y cereales integrales. El mdico la ayudar a determinar la cantidad de peso que puede aumentar.  No coma carne cruda ni quesos sin cocinar. Estos elementos contienen bacterias que pueden causar defectos congnitos en el beb.  La ingesta diaria de cuatro o cinco comidas pequeas en lugar de tres comidas abundantes puede ayudar a aliviar las nuseas y los vmitos. Si empieza a tener nuseas, comer algunas galletas saladas puede ser de ayuda. Beber lquidos entre las comidas en lugar de tomarlos durante las comidas tambin puede ayudar a calmar las nuseas y los vmitos.  Si est estreida, consuma alimentos con alto contenido de fibra, como verduras y frutas frescas, y cereales integrales. Beba suficiente lquido para mantener la orina clara o de color amarillo plido. Actividad y ejercicios  Haga ejercicio solamente como se lo haya indicado el mdico. El  ejercicio la ayudar a:  Controlar el peso.  Mantenerse en forma.  Estar preparada para el trabajo de parto y el parto.  Los dolores, los clicos en la parte baja del abdomen o los calambres en la cintura son un buen indicio de que debe dejar de hacer ejercicios. Consulte al mdico antes de seguir haciendo ejercicios normales.  Intente no estar de pie durante mucho tiempo. Mueva las piernas con frecuencia si debe estar de pie en un lugar durante mucho tiempo.  Evite levantar pesos excesivos.  Use zapatos de tacones bajos y mantenga una buena postura.  Puede seguir teniendo relaciones sexuales, excepto que el mdico le indique lo contrario. Alivio del dolor o las molestias  Use un sostn que le brinde buen soporte si siente dolor a la palpacin en las mamas.  Dese baos de asiento con agua tibia para aliviar el dolor o las molestias causadas por las hemorroides. Use crema antihemorroidal si el mdico se lo permite.  Descanse con las piernas elevadas si tiene calambres o dolor de cintura.  Si tiene venas varicosas en las piernas, use medias de descanso. Eleve los pies durante 15minutos, 3 o 4veces por da. Limite la cantidad de sal en su dieta. Cuidados prenatales  Programe las visitas prenatales para la semana12 de embarazo. Generalmente se programan cada mes al principio y se hacen ms frecuentes en los 2 ltimos meses antes   del parto.  Escriba sus preguntas. Llvelas cuando concurra a las visitas prenatales.  Concurra a todas las visitas prenatales como se lo haya indicado el mdico. Seguridad  Colquese el cinturn de seguridad cuando conduzca.  Haga una lista de los nmeros de telfono de emergencia, que incluya los nmeros de telfono de familiares, amigos, el hospital y los departamentos de polica y bomberos. Consejos generales  Pdale al mdico que la derive a clases de educacin prenatal en su localidad. Debe comenzar a tomar las clases antes de entrar en el mes6  de embarazo.  Pida ayuda si tiene necesidades nutricionales o de asesoramiento durante el embarazo. El mdico puede aconsejarla o derivarla a especialistas para que la ayuden con diferentes necesidades.  No se d baos de inmersin en agua caliente, baos turcos ni saunas.  No se haga duchas vaginales ni use tampones o toallas higinicas perfumadas.  No mantenga las piernas cruzadas durante mucho tiempo.  Evite el contacto con las bandejas sanitarias de los gatos y la tierra que estos animales usan. Estos elementos contienen bacterias que pueden causar defectos congnitos al beb y la posible prdida del feto debido a un aborto espontneo o muerte fetal.  No fume, no consuma hierbas ni medicamentos que no hayan sido recetados por el mdico. Las sustancias qumicas que estos productos contienen afectan la formacin y el desarrollo del beb.  Programe una cita con el dentista. En su casa, lvese los dientes con un cepillo dental blando y psese el hilo dental con suavidad. SOLICITE ATENCIN MDICA SI:   Tiene mareos.  Siente clicos leves, presin en la pelvis o dolor persistente en el abdomen.  Tiene nuseas, vmitos o diarrea persistentes.  Tiene secrecin vaginal con mal olor.  Siente dolor al orinar.  Tiene el rostro, las manos, las piernas o los tobillos ms hinchados. SOLICITE ATENCIN MDICA DE INMEDIATO SI:   Tiene fiebre.  Tiene una prdida de lquido por la vagina.  Tiene sangrado o pequeas prdidas vaginales.  Siente dolor intenso o clicos en el abdomen.  Sube o baja de peso rpidamente.  Vomita sangre de color rojo brillante o material que parezca granos de caf.  Ha estado expuesta a la rubola y no ha sufrido la enfermedad.  Ha estado expuesta a la quinta enfermedad o a la varicela.  Tiene un dolor de cabeza intenso.  Le falta el aire.  Sufre cualquier tipo de traumatismo, por ejemplo, debido a una cada o un accidente automovilstico. Document  Released: 09/25/2005 Document Revised: 05/02/2014 [employer] Patient Information 2015 [employer]. This information is not intended to replace advice given to you by your health care provider. Make sure you discuss any questions you have with your health care provider.   

## 2015-08-10 ENCOUNTER — Other Ambulatory Visit (HOSPITAL_COMMUNITY): Payer: Self-pay | Admitting: Physician Assistant

## 2015-08-10 DIAGNOSIS — Z3A19 19 weeks gestation of pregnancy: Secondary | ICD-10-CM

## 2015-08-10 DIAGNOSIS — Z3689 Encounter for other specified antenatal screening: Secondary | ICD-10-CM

## 2015-08-10 LAB — OB RESULTS CONSOLE ABO/RH: RH TYPE: POSITIVE

## 2015-08-10 LAB — OB RESULTS CONSOLE RPR: RPR: NONREACTIVE

## 2015-08-10 LAB — OB RESULTS CONSOLE RUBELLA ANTIBODY, IGM: RUBELLA: IMMUNE

## 2015-08-10 LAB — OB RESULTS CONSOLE GC/CHLAMYDIA
CHLAMYDIA, DNA PROBE: NEGATIVE
GC PROBE AMP, GENITAL: NEGATIVE

## 2015-08-10 LAB — OB RESULTS CONSOLE HEPATITIS B SURFACE ANTIGEN: HEP B S AG: NEGATIVE

## 2015-08-10 LAB — OB RESULTS CONSOLE HIV ANTIBODY (ROUTINE TESTING): HIV: NONREACTIVE

## 2015-08-10 LAB — OB RESULTS CONSOLE ANTIBODY SCREEN: Antibody Screen: NEGATIVE

## 2015-09-20 ENCOUNTER — Ambulatory Visit (HOSPITAL_COMMUNITY)
Admission: RE | Admit: 2015-09-20 | Discharge: 2015-09-20 | Disposition: A | Discharge status: Home / Self Care | Payer: Medicaid Other | Source: Ambulatory Visit | Attending: Physician Assistant | Admitting: Physician Assistant

## 2015-09-20 DIAGNOSIS — Z3689 Encounter for other specified antenatal screening: Secondary | ICD-10-CM

## 2015-09-20 DIAGNOSIS — Z3A19 19 weeks gestation of pregnancy: Secondary | ICD-10-CM | POA: Insufficient documentation

## 2015-09-20 DIAGNOSIS — Z36 Encounter for antenatal screening of mother: Secondary | ICD-10-CM | POA: Diagnosis present

## 2015-12-31 NOTE — L&D Delivery Note (Signed)
Patient is 35 y.o. [id number] [redacted]w[redacted]d admitted SOL. AROM for augmentation   Delivery Note At 11:17 AM a viable female was delivered via Vaginal, Spontaneous Delivery (Presentation:left  ;OA  ). Nuchal x 1 reduced after delivery.  APGAR: 9,9 ; weight: pending  .   Placenta status: spontaneous ,intact .  Cord: 3 vessels with the following complications: none .  Cord pH: not obtained  Anesthesia: Epidural  Episiotomy:  none Lacerations:  Small 1st degree perinea laceration not requiring repair Suture Repair: n/a Est. Blood Loss (mL): 150   Mom to postpartum.  Baby to Couplet care / Skin to Skin.  Palma Holter 02/08/2016, 11:33 AM   Upon arrival patient was complete and pushing. She pushed with good maternal effort to deliver a healthy baby girl. Baby delivered without difficulty, was noted to have good tone and place on maternal abdomen for oral suctioning, drying and stimulation. Delayed cord clamping performed. Placenta delivered intact with 3V cord. Vaginal canal and perineum was inspected and 1st degree perineal laceration noted that did not require repair; hemostatic. Pitocin was started and uterus massaged until bleeding slowed. Counts of sharps, instruments, and lap pads were all correct.   Palma Holter, MD PGY 1 Family Medicine

## 2016-01-17 LAB — OB RESULTS CONSOLE GBS: GBS: NEGATIVE

## 2016-02-08 ENCOUNTER — Encounter (HOSPITAL_COMMUNITY): Payer: Self-pay | Admitting: *Deleted

## 2016-02-08 ENCOUNTER — Inpatient Hospital Stay (HOSPITAL_COMMUNITY)
Admission: AD | Admit: 2016-02-08 | Discharge: 2016-02-09 | DRG: 775 | Disposition: A | Discharge status: Home / Self Care | Payer: Medicaid Other | Source: Ambulatory Visit | Attending: Obstetrics & Gynecology | Admitting: Obstetrics & Gynecology

## 2016-02-08 ENCOUNTER — Inpatient Hospital Stay (HOSPITAL_COMMUNITY): Payer: Medicaid Other | Admitting: Anesthesiology

## 2016-02-08 DIAGNOSIS — Z3A39 39 weeks gestation of pregnancy: Secondary | ICD-10-CM

## 2016-02-08 DIAGNOSIS — IMO0001 Reserved for inherently not codable concepts without codable children: Secondary | ICD-10-CM

## 2016-02-08 HISTORY — DX: Essential (primary) hypertension: I10

## 2016-02-08 LAB — CBC
HEMATOCRIT: 40.1 % (ref 36.0–46.0)
Hemoglobin: 14 g/dL (ref 12.0–15.0)
MCH: 31.7 pg (ref 26.0–34.0)
MCHC: 34.9 g/dL (ref 30.0–36.0)
MCV: 90.7 fL (ref 78.0–100.0)
Platelets: 260 10*3/uL (ref 150–400)
RBC: 4.42 MIL/uL (ref 3.87–5.11)
RDW: 14.1 % (ref 11.5–15.5)
WBC: 13.1 10*3/uL — ABNORMAL HIGH (ref 4.0–10.5)

## 2016-02-08 LAB — TYPE AND SCREEN
ABO/RH(D): A POS
Antibody Screen: NEGATIVE

## 2016-02-08 LAB — ABO/RH: ABO/RH(D): A POS

## 2016-02-08 LAB — RPR: RPR Ser Ql: NONREACTIVE

## 2016-02-08 MED ORDER — SIMETHICONE 80 MG PO CHEW: 80.0000 mg | CHEWABLE_TABLET | ORAL | Status: DC | PRN

## 2016-02-08 MED ORDER — OXYTOCIN 10 UNIT/ML IJ SOLN
2.5000 [IU]/h | INTRAVENOUS | Status: DC
  Filled 2016-02-08: qty 4

## 2016-02-08 MED ORDER — IBUPROFEN 600 MG PO TABS
600.0000 mg | ORAL_TABLET | Freq: Four times a day (QID) | ORAL | Status: DC
  Administered 2016-02-08 – 2016-02-09 (×5): 600 mg via ORAL
  Filled 2016-02-08 (×5): qty 1

## 2016-02-08 MED ORDER — DIBUCAINE 1 % RE OINT
1.0000 | TOPICAL_OINTMENT | RECTAL | Status: DC | PRN
Start: 2016-02-08 — End: 2016-02-10

## 2016-02-08 MED ORDER — OXYCODONE-ACETAMINOPHEN 5-325 MG PO TABS: 2.0000 | ORAL_TABLET | ORAL | Status: DC | PRN

## 2016-02-08 MED ORDER — LIDOCAINE HCL (PF) 1 % IJ SOLN
30.0000 mL | INTRAMUSCULAR | Status: DC | PRN
  Filled 2016-02-08: qty 30

## 2016-02-08 MED ORDER — BENZOCAINE-MENTHOL 20-0.5 % EX AERO
1.0000 "application " | INHALATION_SPRAY | CUTANEOUS | Status: DC | PRN
  Administered 2016-02-08: 1 via TOPICAL
  Filled 2016-02-08: qty 56

## 2016-02-08 MED ORDER — OXYCODONE-ACETAMINOPHEN 5-325 MG PO TABS
1.0000 | ORAL_TABLET | ORAL | Status: DC | PRN
Start: 2016-02-08 — End: 2016-02-08

## 2016-02-08 MED ORDER — DIPHENHYDRAMINE HCL 50 MG/ML IJ SOLN: 12.5000 mg | INTRAMUSCULAR | Status: DC | PRN

## 2016-02-08 MED ORDER — ACETAMINOPHEN 325 MG PO TABS: 650.0000 mg | ORAL_TABLET | ORAL | Status: DC | PRN

## 2016-02-08 MED ORDER — CITRIC ACID-SODIUM CITRATE 334-500 MG/5ML PO SOLN: 30.0000 mL | ORAL | Status: DC | PRN

## 2016-02-08 MED ORDER — LIDOCAINE HCL (PF) 1 % IJ SOLN
INTRAMUSCULAR | Status: DC | PRN
  Administered 2016-02-08 (×2): 5 mL

## 2016-02-08 MED ORDER — PHENYLEPHRINE 40 MCG/ML (10ML) SYRINGE FOR IV PUSH (FOR BLOOD PRESSURE SUPPORT)
80.0000 ug | PREFILLED_SYRINGE | INTRAVENOUS | Status: DC | PRN
  Filled 2016-02-08: qty 2

## 2016-02-08 MED ORDER — OXYTOCIN BOLUS FROM INFUSION
500.0000 mL | INTRAVENOUS | Status: DC
  Administered 2016-02-08: 500 mL via INTRAVENOUS

## 2016-02-08 MED ORDER — ONDANSETRON HCL 4 MG/2ML IJ SOLN: 4.0000 mg | Freq: Four times a day (QID) | INTRAMUSCULAR | Status: DC | PRN

## 2016-02-08 MED ORDER — FENTANYL CITRATE (PF) 100 MCG/2ML IJ SOLN
100.0000 ug | INTRAMUSCULAR | Status: DC | PRN
  Administered 2016-02-08: 100 ug via INTRAVENOUS
  Filled 2016-02-08: qty 2

## 2016-02-08 MED ORDER — TETANUS-DIPHTH-ACELL PERTUSSIS 5-2.5-18.5 LF-MCG/0.5 IM SUSP: 0.5000 mL | Freq: Once | INTRAMUSCULAR | Status: DC

## 2016-02-08 MED ORDER — ONDANSETRON HCL 4 MG PO TABS: 4.0000 mg | ORAL_TABLET | ORAL | Status: DC | PRN

## 2016-02-08 MED ORDER — ZOLPIDEM TARTRATE 5 MG PO TABS: 5.0000 mg | ORAL_TABLET | Freq: Every evening | ORAL | Status: DC | PRN

## 2016-02-08 MED ORDER — FENTANYL 2.5 MCG/ML BUPIVACAINE 1/10 % EPIDURAL INFUSION (WH - ANES)
14.0000 mL/h | INTRAMUSCULAR | Status: DC | PRN
  Administered 2016-02-08 (×2): 14 mL/h via EPIDURAL

## 2016-02-08 MED ORDER — LACTATED RINGERS IV SOLN
500.0000 mL | INTRAVENOUS | Status: DC | PRN
  Administered 2016-02-08: 500 mL via INTRAVENOUS

## 2016-02-08 MED ORDER — LACTATED RINGERS IV SOLN
INTRAVENOUS | Status: DC
  Administered 2016-02-08 (×2): via INTRAVENOUS

## 2016-02-08 MED ORDER — EPHEDRINE 5 MG/ML INJ
10.0000 mg | INTRAVENOUS | Status: DC | PRN
Start: 2016-02-08 — End: 2016-02-08
  Filled 2016-02-08: qty 2

## 2016-02-08 MED ORDER — FENTANYL 2.5 MCG/ML BUPIVACAINE 1/10 % EPIDURAL INFUSION (WH - ANES)
INTRAMUSCULAR | Status: AC
  Filled 2016-02-08: qty 125

## 2016-02-08 MED ORDER — PRENATAL MULTIVITAMIN CH
1.0000 | ORAL_TABLET | Freq: Every day | ORAL | Status: DC
  Administered 2016-02-08 – 2016-02-09 (×2): 1 via ORAL
  Filled 2016-02-08 (×2): qty 1

## 2016-02-08 MED ORDER — DIPHENHYDRAMINE HCL 25 MG PO CAPS: 25.0000 mg | ORAL_CAPSULE | Freq: Four times a day (QID) | ORAL | Status: DC | PRN

## 2016-02-08 MED ORDER — LANOLIN HYDROUS EX OINT: TOPICAL_OINTMENT | CUTANEOUS | Status: DC | PRN

## 2016-02-08 MED ORDER — WITCH HAZEL-GLYCERIN EX PADS
1.0000 | MEDICATED_PAD | CUTANEOUS | Status: DC | PRN
Start: 2016-02-08 — End: 2016-02-10

## 2016-02-08 MED ORDER — PHENYLEPHRINE 40 MCG/ML (10ML) SYRINGE FOR IV PUSH (FOR BLOOD PRESSURE SUPPORT)
PREFILLED_SYRINGE | INTRAVENOUS | Status: AC
  Filled 2016-02-08: qty 20

## 2016-02-08 MED ORDER — MISOPROSTOL 200 MCG PO TABS
800.0000 ug | ORAL_TABLET | Freq: Once | ORAL | Status: AC
  Administered 2016-02-08: 800 ug via ORAL

## 2016-02-08 MED ORDER — SENNOSIDES-DOCUSATE SODIUM 8.6-50 MG PO TABS
2.0000 | ORAL_TABLET | ORAL | Status: DC
  Administered 2016-02-08: 2 via ORAL
  Filled 2016-02-08: qty 2

## 2016-02-08 MED ORDER — ONDANSETRON HCL 4 MG/2ML IJ SOLN: 4.0000 mg | INTRAMUSCULAR | Status: DC | PRN

## 2016-02-08 MED ORDER — MISOPROSTOL 200 MCG PO TABS
ORAL_TABLET | ORAL | Status: AC
  Filled 2016-02-08: qty 4

## 2016-02-08 NOTE — Anesthesia Procedure Notes (Signed)
Epidural Patient location during procedure: OB  Staffing Anesthesiologist: Sherrian Divers Performed by: anesthesiologist   Preanesthetic Checklist Completed: patient identified, site marked, surgical consent, pre-op evaluation, timeout performed, IV checked, risks and benefits discussed and monitors and equipment checked  Epidural Patient position: sitting Prep: DuraPrep Patient monitoring: heart rate and blood pressure Approach: midline Location: L3-L4 Injection technique: LOR saline  Needle:  Needle type: Tuohy  Needle gauge: 17 G Needle length: 9 cm and 9 Needle insertion depth: 4.5 cm Catheter type: closed end flexible Catheter size: 19 Gauge Catheter at skin depth: 8.5 cm Test dose: negative and Other  Assessment Events: blood not aspirated, injection not painful, no injection resistance, negative IV test and no paresthesia  Additional Notes Test dose 1% Lidocaine   Patient tolerated the insertion well without complications.Reason for block:procedure for pain

## 2016-02-08 NOTE — MAU Note (Signed)
Dorathy Kinsman CNM to bedside with [place]; vertex presentation confirmed by [place].

## 2016-02-08 NOTE — MAU Note (Signed)
Contractions, denies bleeding or ROM 

## 2016-02-08 NOTE — Anesthesia Preprocedure Evaluation (Addendum)
Anesthesia Evaluation  Patient identified by MRN, date of birth, ID band Patient awake    Reviewed: Allergy & Precautions, NPO status , Patient's Chart, lab work & pertinent test results  Airway Mallampati: II  TM Distance: >3 FB Neck ROM: Full    Dental no notable dental hx.    Pulmonary neg pulmonary ROS,    Pulmonary exam normal breath sounds clear to auscultation       Cardiovascular hypertension, negative cardio ROS Normal cardiovascular exam Rhythm:Regular Rate:Normal     Neuro/Psych negative neurological ROS  negative psych ROS   GI/Hepatic negative GI ROS, Neg liver ROS,   Endo/Other  negative endocrine ROS  Renal/GU negative Renal ROS  negative genitourinary   Musculoskeletal negative musculoskeletal ROS (+)   Abdominal   Peds negative pediatric ROS (+)  Hematology negative hematology ROS (+)   Anesthesia Other Findings   Reproductive/Obstetrics negative OB ROS                            Anesthesia Physical Anesthesia Plan  ASA: II  Anesthesia Plan: Epidural   Post-op Pain Management:    Induction: Intravenous  Airway Management Planned: Natural Airway  Additional Equipment:   Intra-op Plan:   Post-operative Plan:   Informed Consent: I have reviewed the patients History and Physical, chart, labs and discussed the procedure including the risks, benefits and alternatives for the proposed anesthesia with the patient or authorized representative who has indicated his/her understanding and acceptance.   Dental advisory given  Plan Discussed with: CRNA  Anesthesia Plan Comments: (Informed consent obtained prior to proceeding including risk of failure, 1% risk of PDPH, risk of minor discomfort and bruising.  Discussed rare but serious complications including epidural abscess, permanent nerve injury, epidural hematoma.  Discussed alternatives to epidural analgesia and  patient desires to proceed.  Timeout performed pre-procedure verifying patient name, procedure, and platelet count.  Patient tolerated procedure well.  Discussed r/b through professional interpreter Eunice Blase.)       Anesthesia Quick Evaluation

## 2016-02-08 NOTE — Anesthesia Postprocedure Evaluation (Signed)
Anesthesia Post Note  Patient: [name]  Procedure(s) Performed: * No procedures listed *  Patient location during evaluation: Mother Baby Anesthesia Type: Epidural Level of consciousness: patient remains intubated per anesthesia plan, awake and awake and alert Pain management: pain level controlled Vital Signs Assessment: post-procedure vital signs reviewed and stable Respiratory status: spontaneous breathing Cardiovascular status: blood pressure returned to baseline Postop Assessment: no headache, adequate PO intake, no backache, patient able to bend at knees, epidural receding and no signs of nausea or vomiting Anesthetic complications: no    Last Vitals:  Filed Vitals:   02/08/16 1344 02/08/16 1508  BP: 133/79 133/85  Pulse: 74 88  Temp: 37.6 C 37.4 C  Resp: 18 18    Last Pain:  Filed Vitals:   02/08/16 1621  PainSc: 0-No pain                 Holston Oyama, Weldon Picking

## 2016-02-08 NOTE — H&P (Signed)
[name] is a 35 y.o. female G48P2002 @ 39.2wks by LMP and confirmed w/ 20wk U/S presenting for eval of ctx. Denies leaking or bldg. Her preg has been followed by the [facility] and has been remarkable for 1) hx PIH 2) hx GDM  History OB History    Gravida Para Term Preterm AB TAB SAB Ectopic Multiple Living   Past Medical History  Diagnosis Date  . Hypertension    Past Surgical History  Procedure Laterality Date  . No past surgeries     Family History: family history is not on file. Social History:  reports that she has never smoked. She has never used smokeless tobacco. She reports that she does not drink alcohol or use illicit drugs.   Prenatal Transfer Tool  Maternal Diabetes: No Genetic Screening: Normal Maternal Ultrasounds/Referrals: Normal Fetal Ultrasounds or other Referrals:  None Maternal Substance Abuse:  No Significant Maternal Medications:  None Significant Maternal Lab Results:  Lab values include: Group B Strep negative Other Comments:  None  ROS  Cx 4cm/80/-2  BP in MAU: 127/76 Blood pressure 144/99, pulse 59, temperature 97.9 F (36.6 C), temperature source Oral, resp. rate 20, height 5' (1.524 m), weight 63.05 kg (139 lb), last menstrual period 05/09/2015, SpO2 100 %. Exam Physical Exam  Constitutional: She is oriented to person, place, and time. She appears well-developed.  HENT:  Head: Normocephalic.  Neck: Normal range of motion.  Cardiovascular: Normal rate.   Respiratory: Effort normal.  GI:  EFM 130s, +accels, no decels Ctx q 5 mins  Genitourinary: Vagina normal.  Musculoskeletal: Normal range of motion.  Neurological: She is alert and oriented to person, place, and time.  Skin: Skin is warm and dry.  Psychiatric: She has a normal mood and affect. Her behavior is normal. Thought content normal.    Prenatal labs: ABO, Rh: --/--/A POS (02/09 0420) Antibody: NEG (02/09 0420) Rubella: Immune (08/11 0000) RPR:  Nonreactive (08/11 0000)  HBsAg: Negative (08/11 0000)  HIV: Non-reactive (08/11 0000)  GBS: Negative (01/18 0000)   Assessment/Plan: IUP@39 .2wks Early active labor GBS neg  Admit to [facility] Expectant management Anticipate SVD   Cam Hai CNM 02/08/2016, 6:02 AM

## 2016-02-08 NOTE — Progress Notes (Signed)
Labor Progress Note  Alleene Stoy is a 35 y.o. G3P2002 at [redacted]w[redacted]d  admitted for SOL  S:  Doing well. Mild pain but controlled with epidural    O:  BP 116/73 mmHg  Pulse 97  Temp(Src) 98.5 F (36.9 C) (Oral)  Resp 18  Ht 5' (1.524 m)  Wt 63.05 kg (139 lb)  BMI 27.15 kg/m2  SpO2 97%  LMP 05/09/2015    FHT:  FHR: 135-140 bpm, variability: moderate,  accelerations:  Present,  decelerations:  Present few mild variables over the past 10 minutes  UC: 1.5- 3 mins SVE:   Dilation: 6 Effacement (%): 80 Station: -2 Exam by:: K.Shaw, CNM Intact   Labs: Lab Results  Component Value Date   WBC 13.1* 02/08/2016   HGB 14.0 02/08/2016   HCT 40.1 02/08/2016   MCV 90.7 02/08/2016   PLT 260 02/08/2016    Assessment / Plan: 35 y.o. G3P2002 [redacted]w[redacted]d active labor Spontaneous labor, progressing normally  Labor: Progressing normally; AROM  Fetal Wellbeing:  Category II; mild variable decel x 1-2 over the past 20 minutes  Pain Control:  Epidural Anticipated MOD:  NSVD   Palma Holter, MD PGY 1 Family Medicine

## 2016-02-08 NOTE — MAU Note (Signed)
Transferred to 163, EFM reapplied.

## 2016-02-08 NOTE — Progress Notes (Signed)
[name] is a 35 y.o. G3P2002 at [redacted]w[redacted]d  admitted for active labor  Subjective: Rec'd IV pain meds; now would like epidural  Objective: BP 144/99 mmHg  Pulse 59  Temp(Src) 97.9 F (36.6 C) (Oral)  Resp 20  Ht 5' (1.524 m)  Wt 63.05 kg (139 lb)  BMI 27.15 kg/m2  SpO2 100%  LMP 05/09/2015      FHT:  FHR: 120-130s bpm, variability: moderate,  accelerations:  Present,  decelerations:  Absent UC:   regular, every 3 minutes SVE:   Dilation: 6 Effacement (%): 80 Station: -2 Exam by:: K.Brittinie Wherley, CNM- [facility]  Labs: Lab Results  Component Value Date   WBC 13.1* 02/08/2016   HGB 14.0 02/08/2016   HCT 40.1 02/08/2016   MCV 90.7 02/08/2016   PLT 260 02/08/2016    Assessment / Plan: IUP@39 .2wks Active labor  Consider AROM after comfortable w/ epidural  Havier Deeb CNM 02/08/2016, 6:07 AM

## 2016-02-08 NOTE — Lactation Note (Signed)
This note was copied from a baby's chart. Lactation Consultation Note   Spanish interpreter present. Room full of visitors. Mother states breastfeeding is going well.  She has hand expressed and viewed colostrum. Discussed supply and demand and cluster feeding. Mom encouraged to feed baby 8-12 times/24 hours and with feeding cues.  Mom made aware of O/P services, breastfeeding support groups, community resources, and our phone # for post-discharge questions.  Suggest she call if she needs further assistance.  Patient Name: [name] Today's Date: 02/08/2016     Maternal Data    Feeding Feeding Type: Breast Fed Length of feed: 10 min  LATCH Score/Interventions                      Lactation Tools Discussed/Used     Consult Status      Hardie Pulley 02/08/2016, 8:02 PM

## 2016-02-09 MED ORDER — ACETAMINOPHEN 325 MG PO TABS
650.0000 mg | ORAL_TABLET | ORAL | Status: AC | PRN
Start: 2016-02-09 — End: ?

## 2016-02-09 MED ORDER — IBUPROFEN 600 MG PO TABS: 600.0000 mg | ORAL_TABLET | Freq: Four times a day (QID) | ORAL | Status: DC

## 2016-02-09 MED ORDER — SENNOSIDES-DOCUSATE SODIUM 8.6-50 MG PO TABS: 2.0000 | ORAL_TABLET | ORAL | Status: AC

## 2016-02-09 NOTE — Discharge Instructions (Signed)

## 2016-02-09 NOTE — Discharge Summary (Signed)
OB Discharge Summary     Patient Name: [name] DOB: [birth year] MRN: [id number]  Date of admission: 02/08/2016 Delivering MD: Palma Holter   Date of discharge: 02/09/2016  Admitting diagnosis: 39WKS CONTRACTIONS Intrauterine pregnancy: [redacted]w[redacted]d     Secondary diagnosis:  Active Problems:   Active labor at term  Additional problems:  Hx PIH Hx GDM     Discharge diagnosis: Term Pregnancy Delivered                                                                                                Post partum procedures:none  Augmentation: AROM  Complications: None  Hospital course:  Onset of Labor With Vaginal Delivery     35 y.o. yo [id number] at [redacted]w[redacted]d was admitted in Active Labor on 02/08/2016. Patient had an uncomplicated labor course as follows:  Membrane Rupture Time/Date: 10:08 AM ,02/08/2016   Intrapartum Procedures: Episiotomy: None [1]                                         Lacerations:  None [1]  Patient had a delivery of a Viable infant. 02/08/2016  Information for the patient's newborn:  [name] [[id number]]  Delivery Method: Vaginal, Spontaneous Delivery (Filed from Delivery Summary)    Pateint had an uncomplicated postpartum course.  She is ambulating, tolerating a regular diet, passing flatus, and urinating well. Patient is discharged home in stable condition on 02/09/2016.    Physical exam  Filed Vitals:   02/08/16 1344 02/08/16 1508 02/08/16 1819 02/09/16 0521  BP: 133/79 133/85 123/77 110/80  Pulse: 74 88 79 66  Temp: 99.6 F (37.6 C) 99.3 F (37.4 C) 98.8 F (37.1 C) 97.7 F (36.5 C)  TempSrc: Oral Oral Oral Oral  Resp: Height:      Weight:      SpO2:       General: alert, cooperative and no distress Lochia: appropriate Uterine Fundus: firm Incision: N/A DVT Evaluation: No evidence of DVT seen on physical exam. No significant calf/ankle edema. Labs: Lab Results  Component Value Date   WBC 13.1*  02/08/2016   HGB 14.0 02/08/2016   HCT 40.1 02/08/2016   MCV 90.7 02/08/2016   PLT 260 02/08/2016   No flowsheet data found.  Discharge instruction: per After Visit Summary and "Baby and Me Booklet".  After visit meds:    Medication List    TAKE these medications        acetaminophen 325 MG tablet  Commonly known as:  TYLENOL  Take 2 tablets (650 mg total) by mouth every 4 (four) hours as needed (for pain scale < 4).     ibuprofen 600 MG tablet  Commonly known as:  ADVIL,MOTRIN  Take 1 tablet (600 mg total) by mouth every 6 (six) hours.     Prenatal Vitamins 0.8 MG tablet  Take 1 tablet by mouth daily. Una tableta diario.     Prenatal Vitamins 28-0.8 MG Tabs  Take 1  tablet by mouth daily.     senna-docusate 8.6-50 MG tablet  Commonly known as:  Senokot-S  Take 2 tablets by mouth daily.        Diet: routine diet  Activity: Advance as tolerated. Pelvic rest for 6 weeks.   Outpatient follow up:6 weeks Follow up Appt:No future appointments. Follow up Visit:No Follow-up on file. Follow-up Information    Follow up with [facility]. Schedule an appointment as soon as possible for a visit in 6 weeks.   Why:  post partum check   Contact information:   [address] [place] [address] [phone]       Postpartum contraception: IUD Mirena  Newborn Data: Live born female  Birth Weight: 7 lb 8.8 oz (3425 g) APGAR: 9, 9  Baby Feeding: Breast Disposition:home with mother   02/09/2016 Wynne Dust, MD   I have seen and examined this patient and agree the above assessment.  Respiratory effort normal, lochia appropriate, legs negative,  pain level normal.  [name],[name] 02/09/2016 7:56 AM

## 2016-02-09 NOTE — Lactation Note (Signed)
This note was copied from a baby's chart. Lactation Consultation Note  Patient Name: [name] Today's Date: 02/09/2016 Reason for consult: Follow-up assessment   Follow up with mom and 26 hour old infant. Infant with 7 BF for 10-20 minutes, 2 attempts, 3 voids and 5 stools in last 24 hours. LATCH Scores 7-8 by bedside RN. Infant 7 lb 4.6 oz with a 3% weight loss. Mom reports she feels BF is going well. Parents are awaiting results of Bilirubin. Dr Nancy Marus reports that infant to stay another night to monitor bilirubin. Mom reports she feels fuller today, she denies nipple tenderness.   Plan BF infant 8-12 x in 24 hours at first feeding cues Keep infant awake to feed for minimum of 15-20 minutes/ feeding Awaken infant as needed for feedings Call for assistance with feedings as needed.    Maternal Data Formula Feeding for Exclusion: No  Feeding    LATCH Score/Interventions                      Lactation Tools Discussed/Used     Consult Status Consult Status: Follow-up Date: 02/10/16 Follow-up type: In-patient    [name] 02/09/2016, 2:19 PM

## 2016-02-10 ENCOUNTER — Ambulatory Visit: Payer: Self-pay

## 2016-02-10 NOTE — Lactation Note (Signed)
This note was copied from a baby's chart. Lactation Consultation Note  Mother reports that BF is going well. Denies any questions. Harmony given with instructions for use.  Patient Name: [name] Today's Date: 02/10/2016 Reason for consult: Follow-up assessment   Maternal Data    Feeding    [facility] Score/Interventions                      Lactation Tools Discussed/Used     Consult Status      Soyla Dryer 02/10/2016, 10:33 AM

## 2016-02-10 NOTE — Progress Notes (Signed)
Acknowledged order for social work consult.  Informed that mother was tearful when discharged yesterday.  Met with mother along with Spanish translator Sunday Spillers.  Her family was present and everyone seemed to be in a cheerful mood.  Mother states that she became very emotional when she was told that she would be discharged, but newborn would need to remain hospitalized.  Informed that she was worried about became away from her other 2 children. "I was disappointed with having to stay an extra day".  Mother states that she is excited about being discharged today.  She denies any hx of depression or anxiety, but notes that she was emotional during the pregnancy and would cry when she watched soap operas.  No acute social concern reported or noted at this time.

## 2016-05-23 IMAGING — US US MFM OB COMP +14 WKS
1 series · 14 of 28 positions shown · non-contrast
Comparison: none

[Series 1: us mfm ob comp +14 wks · 14 of 111 slices shown]
[im 5/111]
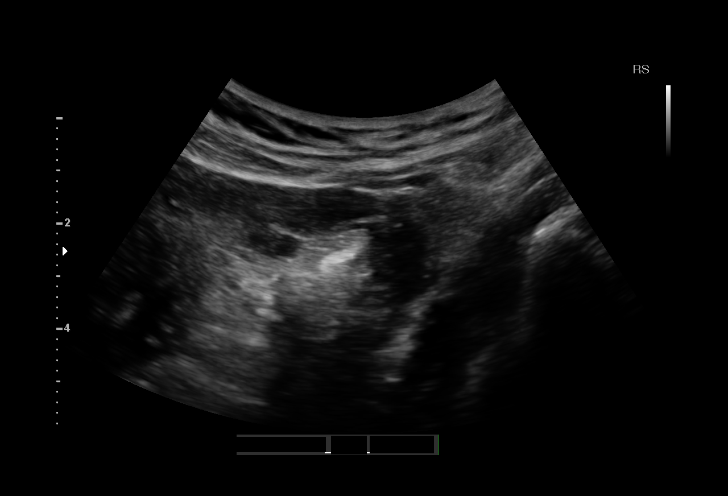
[im 13/111]
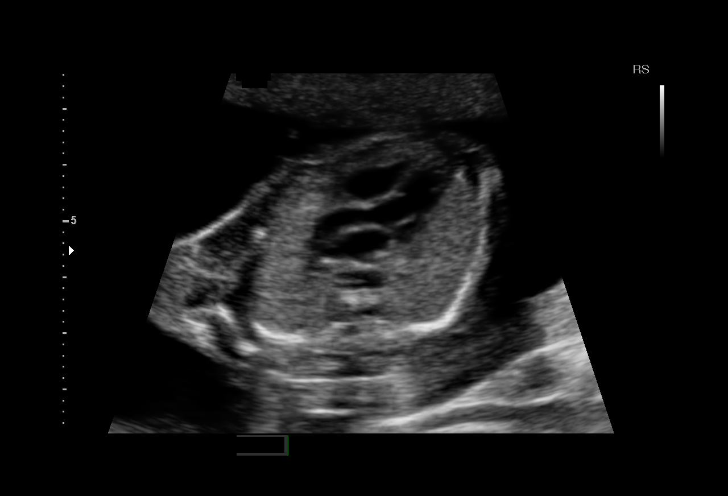
[im 21/111]
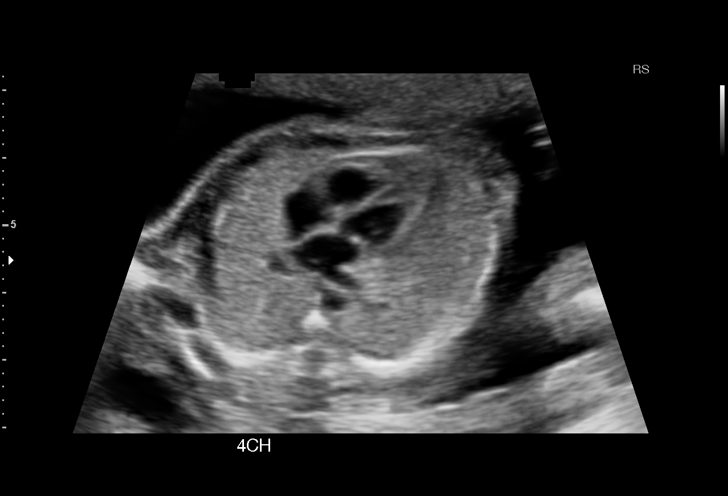
[im 29/111]
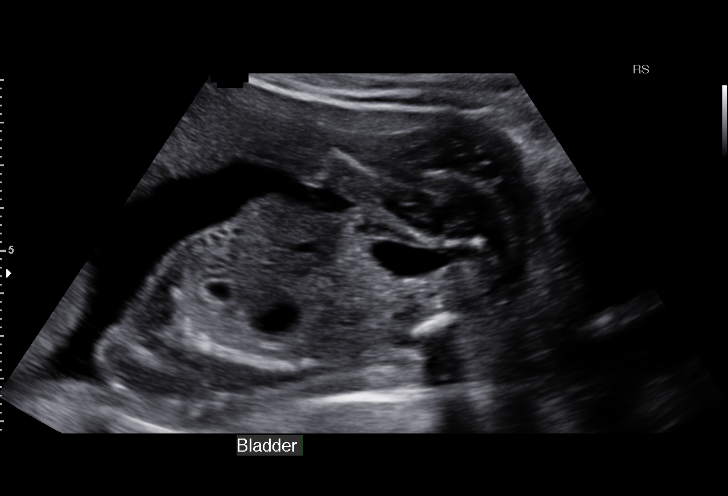
[im 37/111]
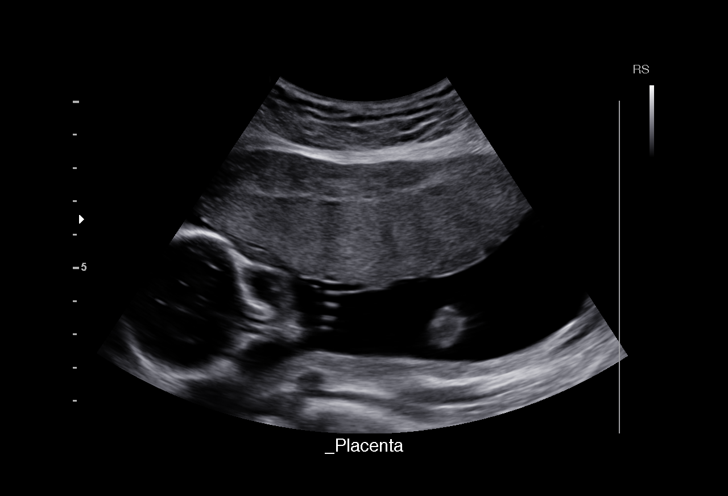
[im 45/111]
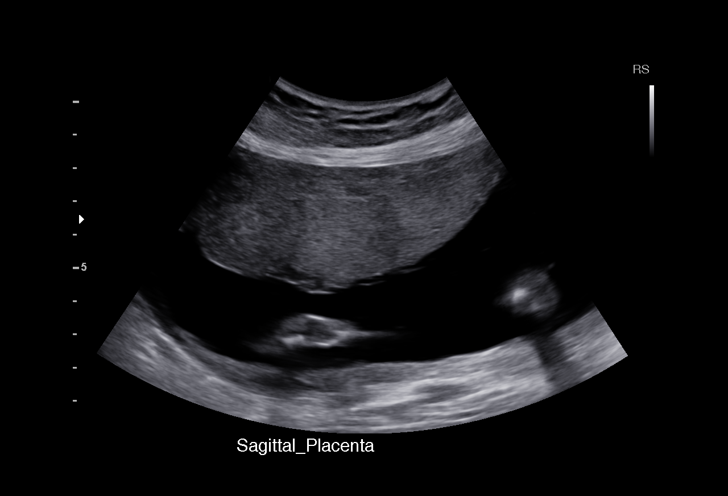
[im 53/111]
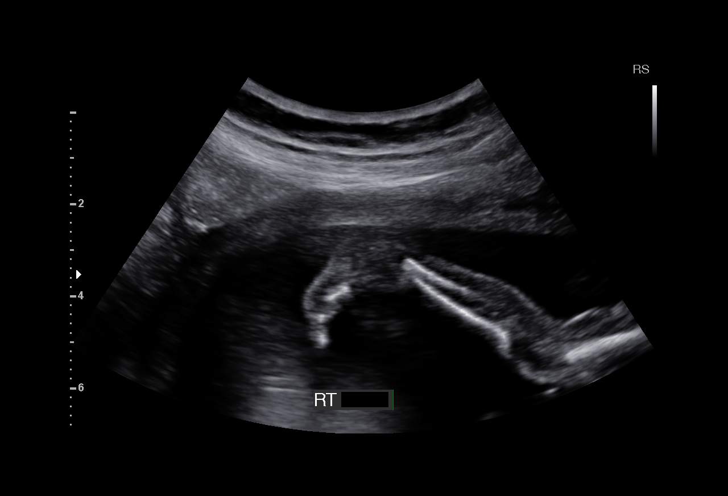
[im 62/111]
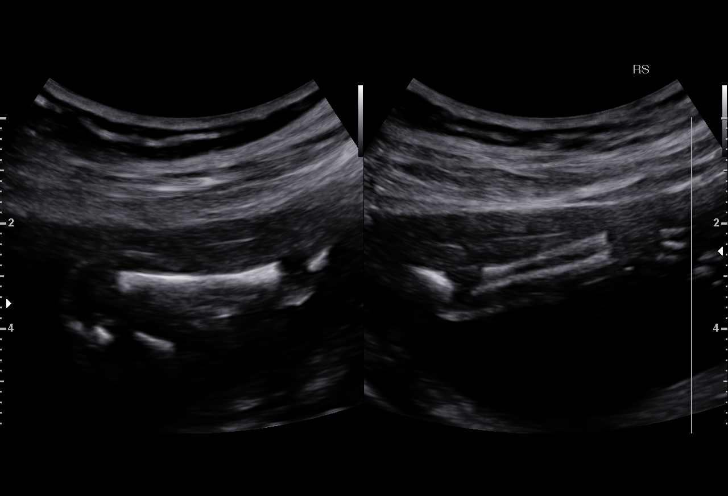
[im 70/111]
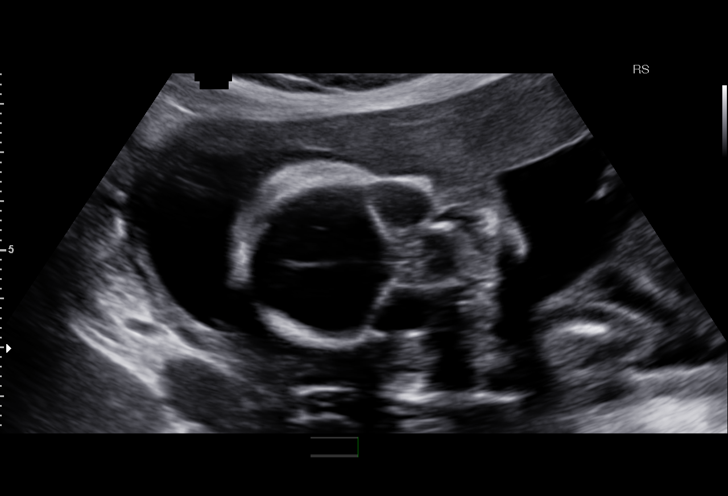
[im 78/111]
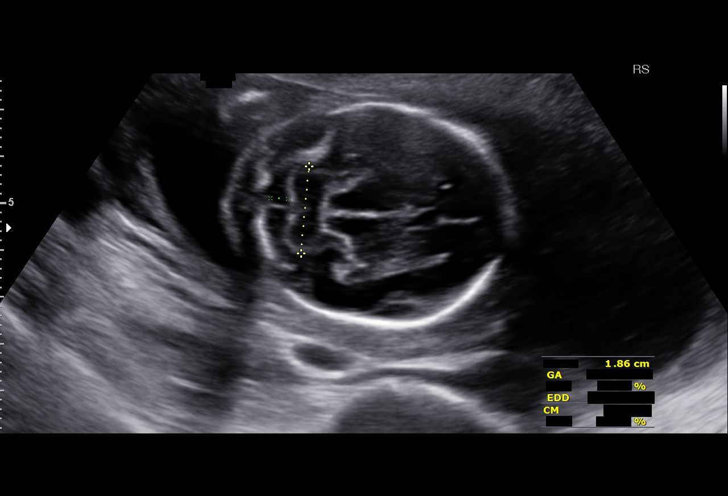
[im 86/111]
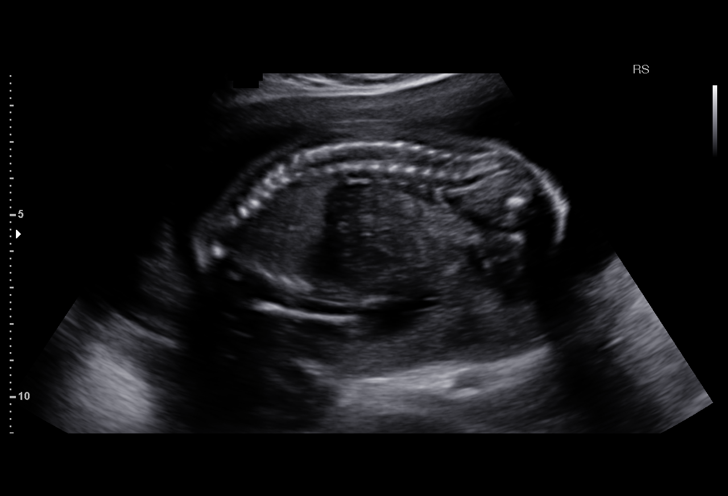
[im 94/111]
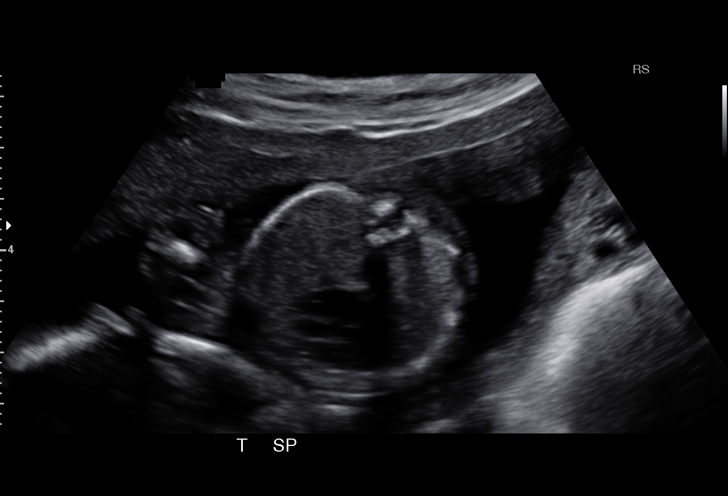
[im 102/111]
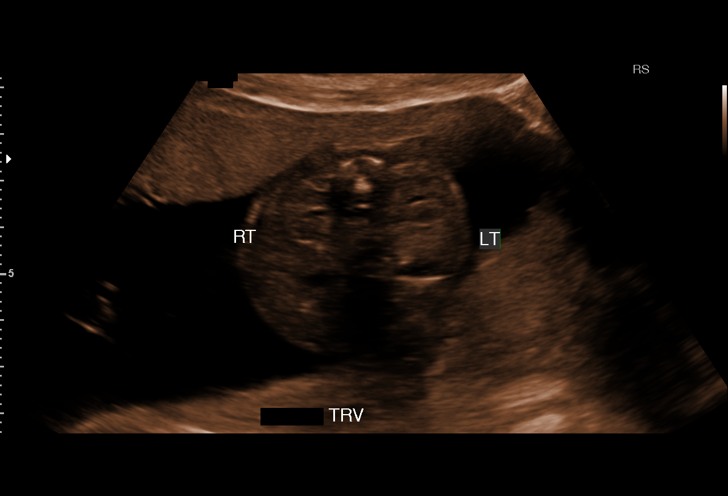
[im 111/111]
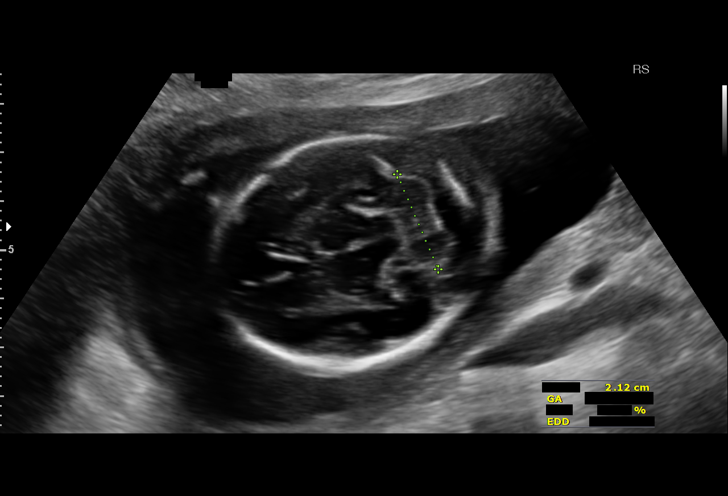

[14 of 28 positions shown; findings below may reference images not displayed]

OBSTETRICS REPORT
(Signed Final 09/20/2015 [DATE])

KLINIK

Service(s) Provided

Indications

20 weeks gestation of pregnancy
Basic anatomic survey                                 Z36
Fetal Evaluation

Num Of Fetuses:    1
Fetal Heart Rate:  135                          bpm
Cardiac Activity:  Observed
Presentation:      Variable
Placenta:          Anterior, above cervical os
P. Cord            Visualized, central
Insertion:

Amniotic Fluid
AFI FV:      Subjectively within normal limits
Larg Pckt:    4.07  cm
Biometry

BPD:     44.5  mm     G. Age:  19w 3d                CI:        68.27   70 - 86
FL/HC:      17.9   16.8 -
19.8
HC:     172.2  mm     G. Age:  19w 6d       33  %    HC/AC:      1.09   1.09 -
1.39
AC:     157.3  mm     G. Age:  20w 6d       73  %    FL/BPD:
FL:      30.9  mm     G. Age:  19w 4d       28  %    FL/AC:      19.6   20 - 24
HUM:     30.3  mm     G. Age:  20w 0d       53  %
CER:     21.2  mm     G. Age:  20w 1d       53  %

Est. FW:     338  gm    0 lb 12 oz      52  %
Gestational Age

LMP:           19w 1d        Date:  05/09/15                 EDD:   02/13/16
U/S Today:     20w 0d                                        EDD:   02/07/16
Best:          20w 0d     Det. By:  U/S (09/20/15)           EDD:   02/07/16
Anatomy
Cranium:          Appears normal         Aortic Arch:      Appears normal
Fetal Cavum:      Appears normal         Ductal Arch:      Appears normal
Ventricles:       Appears normal         Diaphragm:        Appears normal
Choroid Plexus:   Appears normal         Stomach:          Appears normal, left
sided
Cerebellum:       Appears normal         Abdomen:          Appears normal
Posterior Fossa:  Appears normal         Abdominal Wall:   Appears nml (cord
insert, abd wall)
Nuchal Fold:      Not well visualized    Cord Vessels:     Appears normal (3
vessel cord)
Face:             Orbits appear          Kidneys:          Appear normal
normal
Lips:             Appears normal         Bladder:          Appears normal
Heart:            Appears normal         Spine:            Appears normal
(4CH, axis, and
situs)
RVOT:             Appears normal         Lower             Appears normal
Extremities:
LVOT:             Appears normal         Upper             Appears normal
Extremities:

Other:  Heels and Right 5th digit visualized. Fetus appears to be a female.
Technically difficult due to fetal position.
Targeted Anatomy

Fetal Central Nervous System
Lat. Ventricles:  7.0                    Cisterna Magna:
Cervix Uterus Adnexa

Cervical Length:    4.62     cm

Cervix:       Normal appearance by transabdominal scan.
Uterus:       No abnormality visualized.

Left Ovary:    Not visualized.
Right Ovary:   Not visualized.
Adnexa:     No adnexal mass visualized. No abnormality visualized.
Impression

SIUP at 38w8d
EFW 52nd%
no dysmorphic features
no previa
Recommendations

Follow up ultrasounds as clinically indicated.

## 2021-03-14 ENCOUNTER — Inpatient Hospital Stay (HOSPITAL_COMMUNITY)
Admission: AD | Admit: 2021-03-14 | Discharge: 2021-03-14 | Disposition: A | Discharge status: Home / Self Care | Payer: Self-pay | Attending: Obstetrics & Gynecology | Admitting: Obstetrics & Gynecology

## 2021-03-14 ENCOUNTER — Other Ambulatory Visit: Payer: Self-pay

## 2021-03-14 ENCOUNTER — Encounter (HOSPITAL_COMMUNITY): Payer: Self-pay | Admitting: Obstetrics & Gynecology

## 2021-03-14 DIAGNOSIS — O3680X0 Pregnancy with inconclusive fetal viability, not applicable or unspecified: Secondary | ICD-10-CM | POA: Insufficient documentation

## 2021-03-14 DIAGNOSIS — Z3A01 Less than 8 weeks gestation of pregnancy: Secondary | ICD-10-CM | POA: Insufficient documentation

## 2021-03-14 LAB — HCG, QUANTITATIVE, PREGNANCY: hCG, Beta Chain, Quant, S: 24 m[IU]/mL — ABNORMAL HIGH (ref <5)

## 2021-03-14 LAB — POCT PREGNANCY, URINE: Preg Test, Ur: POSITIVE — AB

## 2021-03-14 NOTE — MAU Provider Note (Addendum)
Patient [name] is a 40 y.o. [id number] At [redacted]w[redacted]d here with complaints of bleeding on 3/7 and stopped on 3/11. She had her last LMP in 01/28/21.   She was passing clots and had some pain so she went to [facility] for blood work and had a bHCG of 395 on 03/07/2021.  Patient presents lab report that shows bHCG of 395.   She stopped bleeding on 03/09/2021 and patient reports that she has felt fine since then. No pain or bleeding today.   She went to the [facility] today and they did not see her because she reported a positive bHCG and bleeding over the weekend. Per patient, she is "supposed to have an [place]" today.    UPT faintly positive here in MAU. Will do bHCG.   Patient Vitals for the past 24 hrs:  BP Temp Temp src Pulse Resp SpO2  03/14/21 1806 122/74 98.9 F (37.2 C) Oral 64 16 100 %     Beta HCG today is 24  Discussed with patient that it appears that she is having a miscarriage but that we would want to recheck her  bHCG level again in 48 hours. Patient states that she thinks she is having a miscarriage too.  She would prefer to come Friday evening for her bHCG; name placed in MAU book. Patient instructed to return to MAU if she has any change in bleeding or pain. All questions answered.    Charlesetta Garibaldi Kooistra 03/14/2021, 6:39 PM   Attestation of Attending Supervision of Advanced Practice Provider (PA/CNM/NP): Evaluation and management procedures were performed by the Advanced Practice Provider under my supervision and collaboration.  I have reviewed the Advanced Practice Provider's note and chart, and I agree with the management and plan. I have also made any necessary editorial changes.   Sharon Seller, DO Attending Obstetrician & Gynecologist, [facility] for [facility], [facility] 03/15/2021 2:03 PM

## 2021-03-14 NOTE — MAU Note (Signed)
[name] is a 40 y.o. here in MAU reporting: on march 7 started having vaginal bleeding that lasted for 5 days. On march 9 has a + blood pregnancy test at [facility] [facility]. Pt has papers with her from the clinic, hcg level was 395.81 on 03-07-2021. No pain, bleeding, or discharge today. Pt would like to know if she is still pregnant.  LMP: 01/28/21  Onset of complaint: ongoing  Pain score: 0/10  Vitals:   03/14/21 1806  BP: 122/74  Pulse: 64  Resp: 16  Temp: 98.9 F (37.2 C)  SpO2: 100%     Lab orders placed from triage: upt

## 2021-03-14 NOTE — MAU Note (Signed)
[name] CNM saw pt in Triage and discussed test results and plan of care. Pt will return Friday pm for repeat BHCG.

## 2021-03-14 NOTE — Progress Notes (Signed)
[name] discussed d/c plan with pt and pt voiced understanding to return Friday pm for repeat labs and sooner for any concerns

## 2021-03-14 NOTE — Discharge Instructions (Signed)
-  Regresa al la sala de emergencies de mujeres el Viernes despues de las 7PM.  Tomaremos una Emerson de sangre para saber estado de su embarazo.   -Regresa a la sala de emergencia si empiece mas dolores o mas sangrado.

## 2021-03-16 ENCOUNTER — Inpatient Hospital Stay (HOSPITAL_COMMUNITY)
Admission: AD | Admit: 2021-03-16 | Discharge: 2021-03-16 | Disposition: A | Discharge status: Home / Self Care | Payer: Self-pay | Attending: Family Medicine | Admitting: Family Medicine

## 2021-03-16 DIAGNOSIS — O039 Complete or unspecified spontaneous abortion without complication: Secondary | ICD-10-CM | POA: Insufficient documentation

## 2021-03-16 LAB — HCG, QUANTITATIVE, PREGNANCY: hCG, Beta Chain, Quant, S: 14 m[IU]/mL — ABNORMAL HIGH (ref <5)

## 2021-03-16 NOTE — Discharge Instructions (Signed)
Aborto espontneo Miscarriage El aborto espontneo es la prdida de un embarazo antes de la semana20. La mayor parte de los abortos espontneos ocurre durante los primeros de Psychiatrist. A veces, un aborto ocurre antes de que la mujer sepa que est [place]. El aborto espontneo puede ser Neomia Dear experiencia que afecte emocionalmente a Dealer. Si ha sufrido un aborto espontneo, hable con el mdico y hgale las preguntas que tenga sobre la prdida del beb, el proceso de duelo y los planes futuros de [place]. Cules son las causas? Muchas veces la causa del aborto espontneo no se conoce. Qu incrementa el riesgo? Los siguientes factores pueden hacer que una embarazada sea ms propensa a sufrir un aborto espontneo: Ciertas enfermedades crnicas  Enfermedades que afectan al equilibrio hormonal del cuerpo, como una enfermedad tiroidea o sndrome del ovario poliqustico.  Diabetes.  Trastornos autoinmunitarios.  Infecciones.  Trastornos hemorrgicos.  Obesidad. Factores de estilo de vida  Consumir productos con tabaco o nicotina o estar expuesta al humo del tabaco.  Beber alcohol.  Consumir grandes cantidades de cafena.  Consumo de drogas. Problemas relacionados con las estructuras o los rganos genitales  Insuficiencia cervical. Esto es cuando la parte ms baja del tero (cuello uterino) se abre y se hace ms delgada antes de que el embarazo llegue a trmino.  Padecer una afeccin llamada sndrome de [place]. Este sndrome causa la formacin de cicatrices en el tero o hace que el tero tenga una estructura anormal.  Crecimientos fibrosos, llamados fibromas, en el tero.  Anormalidades congnitas. Estos son problemas que ya estn presentes en el nacimiento.  Infeccin en el cuello del tero. Antecedentes personales o mdicos  Lesiones (traumatismos).  Haber tenido un aborto espontneo antes.  Ser menor de 18aos o mayor de 35aos de edad.  Exposicin a  sustancias nocivas del [address]. Esto puede incluir radiacin o metales pesados, como el plomo.  Uso de ciertos medicamentos. Cules son los signos o sntomas? Los sntomas de esta afeccin incluyen:  Sangrado o manchado vaginal, con o sin clicos o dolor.  Dolor o clicos en el abdomen o en la parte inferior de la espalda.  Salida de lquido o tejido por la vagina. Cmo se diagnostica? Esta afeccin se puede diagnosticar en funcin de lo siguiente:  Un examen fsico.  Una ecografa.  Pruebas de laboratorio, como anlisis de [place], Watson de [place] o hisopados para Engineer, manufacturing una infeccin. Cmo se trata? A veces no es necesario dar tratamiento a un aborto espontneo si todo el tejido fetal que estaba en el tero sale por s solo y no hay otros problemas como infeccin o sangrado abundante. En otros casos, esta afeccin puede tratarse con lo siguiente:  Dilatacin y curetaje (D y C). En este procedimiento, se expande el cuello uterino y se extrae todo resto de tejido fetal del recubrimiento del tero (endometrio).  Medicamentos. Pueden incluir: ? Antibiticos para tratar una infeccin. ? Medicamentos para expulsar los restos de tejido fetal del cuerpo. ? Medicamentos para reducir Theatre manager) el tamao del tero. Estos medicamentos se pueden administrar si hay un sangrado abundante. Si tiene sangre Rh negativa, se le puede administrar una inyeccin de un medicamento llamado inmunoglobulina Rho(D). Este medicamento ayuda a Education officer, community en futuros embarazos. Siga estas instrucciones en su casa: Medicamentos  Use los medicamentos de venta libre y los recetados solamente como se lo haya indicado el mdico.  Si le recetaron antibiticos, tmelos como se lo haya indicado el mdico. No deje de tomar el antibitico [place]  comience a sentirse mejor. Actividad  Haga reposo como se lo haya indicado el mdico. Pregntele al mdico qu actividades son seguras para  usted.  Pdale a alguien que la ayude con las responsabilidades familiares y del hogar durante este tiempo. Instrucciones generales  Est atenta a la cantidad de tejido o cogulos de [employer] de la vagina.  No tenga relaciones sexuales, no se haga duchas vaginales ni se introduzca nada, como tampones, en la vagina hasta que el mdico la autorice.  Para que usted y su pareja puedan sobrellevar el proceso del duelo, hable con su mdico o [place] apoyo psicolgico.  Cuando est lista, visite a su mdico para hablar sobre los pasos importantes que deber seguir en relacin con su salud. Tambin hable [employer] que deber tomar para tener un embarazo saludable en el futuro.  Cumpla con todas las visitas de seguimiento. Esto es importante.   Dnde buscar ms informacin  The [employer] of Obstetricians and Gynecologists (Colegio Estad[employer]idense de Obstetras y Scientific laboratory technician): acog.org  U.S. Department of Health and [employer], Office on [employer] Health (Departamento de Salud y Servicios Humanos de los [place], [place] de Salud de la Mujer): http://hoffman.com/ Comunquese con un mdico si:  Lance Muss o escalofros.  Le sale lquido con mal olor de la vagina.  El sangrado aumenta en vez de disminuir.  Le salen cogulos de sangre o tejido de la vagina. Solicite ayuda de inmediato si:  Siente calambres intensos o dolor en la espalda o en el abdomen.  Tiene un sangrado abundante que llena 2apsitos sanitarios grandes por hora durante ms de 2horas.  Se siente mareada o dbil.  Se desmaya.  Se siente triste, y esa tristeza se apodera de sus pensamientos.  Piensa en hacerse dao. Si alguna vez siente que puede lastimarse o Physicist, medical a Economist, o tiene pensamientos de poner fin a su vida, busque ayuda de inmediato. Dirjase al servicio de urgencias ms cercano o:  Comunquese con el servicio de emergencias de su localidad (911 en los  Estados Unidos).  Llame a una lnea de asistencia al suicida y atencin en crisis como National Suicide Prevention Lifeline (Lnea Nacional de Prevencin del Suicidio) al (782)216-8220. Est disponible las 24 horas del da en los EE.[place].  Enve un mensaje de texto a la lnea para casos de crisis al [phone] (en los EE.UU.). Resumen  La mayor parte de los abortos espontneos ocurre durante los primeros de Psychiatrist. En algunos casos, el aborto espontneo ocurre antes de que la mujer sepa que est [place].  Siga las instrucciones del mdico acerca de los medicamentos y la [place].  Para que usted y su pareja puedan sobrellevar el duelo, hable con su mdico o consiga apoyo psicolgico.  Cumpla con todas las visitas de seguimiento. Esta informacin no tiene Theme park manager el consejo del mdico. Asegrese de hacerle al mdico cualquier pregunta que tenga. Document Revised: 07/19/2020 Document Reviewed: 07/19/2020 Elsevier Patient Education  2021 [employer].

## 2021-03-16 NOTE — MAU Note (Signed)
Pt here for follow up HCG. Denies any pain or vaginal bleeding at this time.

## 2021-03-16 NOTE — MAU Provider Note (Signed)
None     S Ms. Brentney Goldbach is a 40 y.o. [id number] patient who presents to MAU today for follow up hCG level. Patient denies pain or vaginal bleeding.   O BP 132/73    Pulse 60    Temp 98.3 F (36.8 C)    Resp 18    Ht 5\' 2"  (1.575 m)    Wt 56.8 kg    LMP 01/28/2021    BMI 22.90 kg/m   Results for orders placed or performed during the hospital encounter of 03/16/21 (from the past 24 hour(s))  hCG, quantitative, pregnancy     Status: Abnormal   Collection Time: 03/16/21  7:43 PM  Result Value Ref Range   hCG, Beta Chain, Quant, S 14 (H) <5 mIU/mL    Physical Exam Constitutional:      General: She is not in acute distress.    Appearance: Normal appearance.  HENT:     Head: Normocephalic and atraumatic.  Eyes:     Conjunctiva/sclera: Conjunctivae normal.  Pulmonary:     Effort: Pulmonary effort is normal. No respiratory distress.  Musculoskeletal:        General: Normal range of motion.     Cervical back: Normal range of motion.  Neurological:     Mental Status: She is alert and oriented to person, place, and time.  Psychiatric:        Mood and Affect: Mood normal.        Behavior: Behavior normal.        Thought Content: Thought content normal.     A Medical screening exam complete SAB  P -hCG returns and has decreased by 58% since last visit.  -Questions regarding POC addressed.  -Encouraged to follow up for repeat lab work in two weeks. -Patient informed that she would/could incur out-of-pocket costs for this visit and verbalized understanding. -Patient requests follow up appt be made. -Interpretations completed with assistance of in person interpreter Teresita. -Discharge from MAU in stable condition -Patient may return to MAU as needed   [place], Gerrit Heck 03/16/2021 9:12 PM

## 2021-03-30 ENCOUNTER — Ambulatory Visit: Payer: Self-pay | Admitting: Obstetrics and Gynecology
